# Patient Record
Sex: Female | Born: 1985 | Race: Black or African American | Hispanic: No | Marital: Single | Smoking: Former smoker
Health system: Southern US, Community
[De-identification: ages and names within clinical notes are randomized; demographics above are authoritative.]

## PROBLEM LIST (undated history)

## (undated) DIAGNOSIS — K089 Disorder of teeth and supporting structures, unspecified: Secondary | ICD-10-CM

## (undated) DIAGNOSIS — A6000 Herpesviral infection of urogenital system, unspecified: Secondary | ICD-10-CM

## (undated) DIAGNOSIS — G8929 Other chronic pain: Secondary | ICD-10-CM

## (undated) HISTORY — DX: Herpesviral infection of urogenital system, unspecified: A60.00

---
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
## 2005-02-28 ENCOUNTER — Emergency Department (HOSPITAL_COMMUNITY): Admission: EM | Admit: 2005-02-28 | Discharge: 2005-02-28 | Payer: Self-pay | Admitting: Emergency Medicine

## 2007-07-03 ENCOUNTER — Emergency Department (HOSPITAL_COMMUNITY): Admission: EM | Admit: 2007-07-03 | Discharge: 2007-07-03 | Payer: Self-pay | Admitting: Emergency Medicine

## 2009-12-07 ENCOUNTER — Emergency Department (HOSPITAL_COMMUNITY): Admission: EM | Admit: 2009-12-07 | Discharge: 2009-12-07 | Payer: Self-pay | Admitting: Emergency Medicine

## 2010-08-06 ENCOUNTER — Emergency Department (HOSPITAL_COMMUNITY): Admission: EM | Admit: 2010-08-06 | Discharge: 2010-08-06 | Payer: Self-pay | Admitting: Emergency Medicine

## 2011-02-14 LAB — URINALYSIS, ROUTINE W REFLEX MICROSCOPIC
Glucose, UA: NEGATIVE mg/dL
Hgb urine dipstick: NEGATIVE
Specific Gravity, Urine: 1.023 (ref 1.005–1.030)
Urobilinogen, UA: 1 mg/dL (ref 0.0–1.0)

## 2011-09-13 LAB — COMPREHENSIVE METABOLIC PANEL
ALT: 15
AST: 21
Albumin: 3.8
Alkaline Phosphatase: 42
Calcium: 9
GFR calc Af Amer: >60
Glucose, Bld: 99
Potassium: 3.3 — ABNORMAL LOW
Sodium: 140
Total Protein: 6.7

## 2011-09-13 LAB — URINALYSIS, ROUTINE W REFLEX MICROSCOPIC
Ketones, ur: NEGATIVE
Nitrite: NEGATIVE
Urobilinogen, UA: 1
pH: 6

## 2011-09-13 LAB — CBC
Hemoglobin: 12.3
MCHC: 33.5
Platelets: 238
RDW: 15.8 — ABNORMAL HIGH

## 2011-09-13 LAB — DIFFERENTIAL
Basophils Relative: 1
Eosinophils Absolute: 0.1
Eosinophils Relative: 1
Lymphs Abs: 1.8
Monocytes Absolute: 0.4
Monocytes Relative: 10

## 2011-09-13 LAB — PREGNANCY, URINE: Preg Test, Ur: NEGATIVE

## 2011-09-13 LAB — D-DIMER, QUANTITATIVE: D-Dimer, Quant: <0.22

## 2012-08-01 ENCOUNTER — Emergency Department (HOSPITAL_COMMUNITY)
Admission: EM | Admit: 2012-08-01 | Discharge: 2012-08-01 | Disposition: A | Discharge status: Home / Self Care | Payer: Self-pay | Attending: Emergency Medicine | Admitting: Emergency Medicine

## 2012-08-01 ENCOUNTER — Emergency Department (HOSPITAL_COMMUNITY): Payer: Self-pay

## 2012-08-01 ENCOUNTER — Encounter (HOSPITAL_COMMUNITY): Payer: Self-pay | Admitting: *Deleted

## 2012-08-01 DIAGNOSIS — M79673 Pain in unspecified foot: Secondary | ICD-10-CM

## 2012-08-01 DIAGNOSIS — M25579 Pain in unspecified ankle and joints of unspecified foot: Secondary | ICD-10-CM | POA: Insufficient documentation

## 2012-08-01 MED ORDER — HYDROCODONE-ACETAMINOPHEN 5-325 MG PO TABS: 1.0000 | ORAL_TABLET | Freq: Four times a day (QID) | ORAL | Status: AC | PRN

## 2012-08-01 MED ORDER — HYDROCODONE-ACETAMINOPHEN 5-325 MG PO TABS
1.0000 | ORAL_TABLET | Freq: Once | ORAL | Status: AC
  Administered 2012-08-01: 1 via ORAL
  Filled 2012-08-01: qty 1

## 2012-08-01 MED ORDER — IBUPROFEN 800 MG PO TABS: 800.0000 mg | ORAL_TABLET | Freq: Three times a day (TID) | ORAL | Status: AC | PRN

## 2012-08-01 MED ORDER — HYDROCODONE-ACETAMINOPHEN 10-325 MG PO TABS: 1.0000 | ORAL_TABLET | Freq: Once | ORAL | Status: DC

## 2012-08-01 MED ORDER — IBUPROFEN 400 MG PO TABS
800.0000 mg | ORAL_TABLET | Freq: Once | ORAL | Status: AC
  Administered 2012-08-01: 800 mg via ORAL
  Filled 2012-08-01: qty 2

## 2012-08-01 NOTE — ED Notes (Signed)
Ortho tech coming 

## 2012-08-01 NOTE — ED Notes (Signed)
Pt reports stepping wrong on left foot last night, reports unable to bear weight to left foot. Reports she is unable to wiggle her toes.

## 2012-08-01 NOTE — ED Provider Notes (Signed)
History     CSN: [id number]  Arrival date & time 08/01/12  4098   First MD Initiated Contact with Patient 08/01/12 2016      Chief Complaint  Patient presents with  . Foot Pain    (Consider location/radiation/quality/duration/timing/severity/associated sxs/prior treatment) HPI Comments: Pt is 26 yo F that miss stepped walking down the stairs yesterday rolling her left ankle, inversion. Denies numbness tingling or weakness of extremity, Reports her skin is intact and states that pain is worsened w weight bearing & relieved by nothing   Patient is a 26 y.o. female presenting with lower extremity pain.  Foot Pain This is a new problem. The current episode started today. The problem occurs constantly. The problem has been unchanged. Associated symptoms include joint swelling. Pertinent negatives include no congestion, coughing, diaphoresis, fever, headaches, myalgias or neck pain.    History reviewed. No pertinent past medical history.  History reviewed. No pertinent past surgical history.  History reviewed. No pertinent family history.  History  Substance Use Topics  . Smoking status: Never Smoker   . Smokeless tobacco: Not on file  . Alcohol Use: Yes     occ    OB History    Grav Para Term Preterm Abortions TAB SAB Ect Mult Living                  Review of Systems  Constitutional: Negative for fever, diaphoresis and activity change.  HENT: Negative for congestion and neck pain.   Respiratory: Negative for cough.   Genitourinary: Negative for dysuria.  Musculoskeletal: Positive for joint swelling and gait problem. Negative for myalgias.  Skin: Negative for color change and wound.  Neurological: Negative for headaches.  All other systems reviewed and are negative.    Allergies  Review of patient's allergies indicates no known allergies.  Home Medications  No current outpatient prescriptions on file.  BP 136/95  Pulse 62  Temp 99.1 F (37.3 C) (Oral)  Resp  18  SpO2 100%  LMP 07/04/2012  Physical Exam  Nursing note and vitals reviewed. Constitutional: She appears well-developed and well-nourished. No distress.  HENT:  Head: Normocephalic and atraumatic.  Eyes: Conjunctivae and EOM are normal.  Neck: Normal range of motion. Neck supple.  Cardiovascular:       Intact distal pulses, capillary refill < 3 seconds  Musculoskeletal:       Unable to perform active or passive ROM of left foot/ankle d/t pain. All other extremities with normal ROM  Neurological:       No sensory deficit  Skin: She is not diaphoretic.       Skin intact, no tenting    ED Course  Procedures (including critical care time)  Labs Reviewed - No data to display Dg Foot Complete Left  08/01/2012  *RADIOLOGY REPORT*  Clinical Data: Pain post trauma  LEFT FOOT - COMPLETE 3+ VIEW  Comparison: None.  Findings: Frontal, lateral, and oblique views were obtained.  No fracture or dislocation.  Joint spaces appear intact.  No erosive change  IMPRESSION: No appreciable fracture or dislocation.   Original Report Authenticated By: Arvin Collard. WOODRUFF III, M.D.      No diagnosis found.    MDM  Right foot/ankle pain  Patient X-Ray negative for obvious fracture or dislocation. Pain managed in ED. Pt advised to follow up with orthopedics if symptoms persist for possibility of missed fracture diagnosis. Patient given brace while in ED, conservative therapy recommended and discussed. Patient will be dc home &  is agreeable with above plan.         Jaci Carrel, [place] 08/01/12 2054

## 2012-08-01 NOTE — ED Notes (Signed)
The pt fell yesterday and since then has not been able to walk on her lt foot.  Swollen and she just returned from xray

## 2012-08-01 NOTE — Progress Notes (Signed)
Orthopedic Tech Progress Note Patient Details:  Kimberly Yoder [birth year] [id number]  Ortho Devices Type of Ortho Device: Crutches;ASO Ortho Device/Splint Location: (L) LE Ortho Device/Splint Interventions: Application   Jennye Moccasin 08/01/2012, 9:03 PM

## 2012-08-02 NOTE — ED Provider Notes (Signed)
Medical screening examination/treatment/procedure(s) were performed by non-physician practitioner and as supervising physician I was immediately available for consultation/collaboration.  Derwood Kaplan, MD 08/02/12 [phone]

## 2013-05-25 ENCOUNTER — Emergency Department (HOSPITAL_COMMUNITY)
Admission: EM | Admit: 2013-05-25 | Discharge: 2013-05-25 | Disposition: A | Discharge status: Home / Self Care | Payer: Self-pay | Attending: Emergency Medicine | Admitting: Emergency Medicine

## 2013-05-25 ENCOUNTER — Emergency Department (HOSPITAL_COMMUNITY): Payer: Self-pay

## 2013-05-25 ENCOUNTER — Encounter (HOSPITAL_COMMUNITY): Payer: Self-pay | Admitting: Emergency Medicine

## 2013-05-25 DIAGNOSIS — R05 Cough: Secondary | ICD-10-CM | POA: Insufficient documentation

## 2013-05-25 DIAGNOSIS — F17200 Nicotine dependence, unspecified, uncomplicated: Secondary | ICD-10-CM | POA: Insufficient documentation

## 2013-05-25 DIAGNOSIS — R059 Cough, unspecified: Secondary | ICD-10-CM | POA: Insufficient documentation

## 2013-05-25 DIAGNOSIS — J209 Acute bronchitis, unspecified: Secondary | ICD-10-CM | POA: Insufficient documentation

## 2013-05-25 DIAGNOSIS — R079 Chest pain, unspecified: Secondary | ICD-10-CM | POA: Insufficient documentation

## 2013-05-25 DIAGNOSIS — J40 Bronchitis, not specified as acute or chronic: Secondary | ICD-10-CM

## 2013-05-25 LAB — CBC WITH DIFFERENTIAL/PLATELET
Basophils Relative: 0 % (ref 0–1)
Eosinophils Absolute: 0.1 10*3/uL (ref 0.0–0.7)
HCT: 39.1 % (ref 36.0–46.0)
Hemoglobin: 13.4 g/dL (ref 12.0–15.0)
Lymphs Abs: 2 10*3/uL (ref 0.7–4.0)
MCH: 28.6 pg (ref 26.0–34.0)
MCHC: 34.3 g/dL (ref 30.0–36.0)
Monocytes Absolute: 0.6 10*3/uL (ref 0.1–1.0)
Monocytes Relative: 10 % (ref 3–12)
Neutrophils Relative %: 54 % (ref 43–77)
RBC: 4.68 MIL/uL (ref 3.87–5.11)

## 2013-05-25 LAB — POCT I-STAT TROPONIN I: Troponin i, poc: 0 ng/mL (ref 0.00–0.08)

## 2013-05-25 LAB — COMPREHENSIVE METABOLIC PANEL
Albumin: 4.2 g/dL (ref 3.5–5.2)
Alkaline Phosphatase: 54 U/L (ref 39–117)
BUN: 7 mg/dL (ref 6–23)
Chloride: 102 mEq/L (ref 96–112)
Creatinine, Ser: 0.7 mg/dL (ref 0.50–1.10)
GFR calc Af Amer: >90 mL/min (ref >90)
Glucose, Bld: 92 mg/dL (ref 70–99)
Potassium: 3.7 mEq/L (ref 3.5–5.1)
Total Bilirubin: 0.8 mg/dL (ref 0.3–1.2)

## 2013-05-25 MED ORDER — ALBUTEROL SULFATE HFA 108 (90 BASE) MCG/ACT IN AERS
2.0000 | INHALATION_SPRAY | Freq: Once | RESPIRATORY_TRACT | Status: AC
  Administered 2013-05-25: 2 via RESPIRATORY_TRACT
  Filled 2013-05-25: qty 6.7

## 2013-05-25 MED ORDER — HYDROCODONE-ACETAMINOPHEN 5-325 MG PO TABS: 2.0000 | ORAL_TABLET | ORAL | Status: DC | PRN

## 2013-05-25 MED ORDER — AZITHROMYCIN 250 MG PO TABS: ORAL_TABLET | ORAL | Status: DC

## 2013-05-25 NOTE — Progress Notes (Signed)
   CARE MANAGEMENT ED NOTE 05/25/2013  Patient:  Kimberly Yoder, Kimberly Yoder   Account Number:  [id number]  Date Initiated:  05/25/2013  Documentation initiated by:  Radford Pax  Subjective/Objective Assessment:   Patient presents to ED with chest pain     Subjective/Objective Assessment Detail:     Action/Plan:   Action/Plan Detail:   Anticipated DC Date:       Status Recommendation to Physician:   Result of Recommendation:    Other ED Services  Consult Working Plan    DC Planning Services  Other  PCP issues    Choice offered to / List presented to:            Status of service:  Completed, signed off  ED Comments:   ED Comments Detail:  Patient listed as not having a pcp.  EDCM porvided list of physicians who accept medicaid patients.  Informed patient to call DSS when she finds a pcp.

## 2013-05-25 NOTE — ED Provider Notes (Signed)
History    CSN: [id number] Arrival date & time 05/25/13  1239  First MD Initiated Contact with Patient 05/25/13 1447     No chief complaint on file.  (Consider location/radiation/quality/duration/timing/severity/associated sxs/prior Treatment) HPI Comments: Patient presents with cough and chest pain. She states she's had a two-week history of a nonproductive cough. She states that 2 days ago she started having some pain across her chest. She describes it as crampy feeling in the center of her chest and at times it's in her left lower chest. She states it's worse with coughing and worse with breathing. She denies any fevers or chills. She denies any shortness of breath. She denies any leg swelling. She denies any nausea or vomiting. She has no history of DVT, oral contraceptive use, recent immobilization. She is a smoker. She denies a history of past lung disease including asthma. She states the chest pain is intermittent  but seems to be brought on by coughing spells and she feels it more with deep breathing.  History reviewed. No pertinent past medical history. History reviewed. No pertinent past surgical history. No family history on file. History  Substance Use Topics  . Smoking status: Light Tobacco Smoker  . Smokeless tobacco: Not on file  . Alcohol Use: Yes     Comment: occ   OB History   Grav Para Term Preterm Abortions TAB SAB Ect Mult Living                 Review of Systems  Constitutional: Negative for fever, chills, diaphoresis and fatigue.  HENT: Negative for congestion, rhinorrhea and sneezing.   Eyes: Negative.   Respiratory: Positive for cough. Negative for chest tightness and shortness of breath.   Cardiovascular: Positive for chest pain. Negative for leg swelling.  Gastrointestinal: Negative for nausea, vomiting, abdominal pain, diarrhea and blood in stool.  Genitourinary: Negative for frequency, hematuria, flank pain and difficulty urinating.  Musculoskeletal:  Negative for back pain and arthralgias.  Skin: Negative for rash.  Neurological: Negative for dizziness, speech difficulty, weakness, numbness and headaches.    Allergies  Review of patient's allergies indicates no known allergies.  Home Medications   Current Outpatient Rx  Name  Route  Sig  Dispense  Refill  . ibuprofen (ADVIL,MOTRIN) 200 MG tablet   Oral   Take 400 mg by mouth every 6 (six) hours as needed for pain.         Marland Kitchen azithromycin (ZITHROMAX Z-PAK) 250 MG tablet      2 po day one, then 1 daily x 4 days   5 tablet   0   . HYDROcodone-acetaminophen (NORCO/VICODIN) 5-325 MG per tablet   Oral   Take 2 tablets by mouth every 4 (four) hours as needed for pain.   15 tablet   0    BP 119/78  Pulse 88  Temp(Src) 98.6 F (37 C) (Oral)  Resp 15  SpO2 100%  LMP 05/03/2013 Physical Exam  Constitutional: She is oriented to person, place, and time. She appears well-developed and well-nourished.  HENT:  Head: Normocephalic and atraumatic.  Eyes: Pupils are equal, round, and reactive to light.  Neck: Normal range of motion. Neck supple.  Cardiovascular: Normal rate, regular rhythm and normal heart sounds.   Pulmonary/Chest: Effort normal and breath sounds normal. No respiratory distress. She has no wheezes. She has no rales. She exhibits no tenderness.  Abdominal: Soft. Bowel sounds are normal. There is no tenderness. There is no rebound and no guarding.  Musculoskeletal: Normal range of motion. She exhibits no edema.  No calf tenderness  Lymphadenopathy:    She has no cervical adenopathy.  Neurological: She is alert and oriented to person, place, and time.  Skin: Skin is warm and dry. No rash noted.  Psychiatric: She has a normal mood and affect.    ED Course  Procedures (including critical care time) Results for orders placed during the hospital encounter of 05/25/13  CBC WITH DIFFERENTIAL      Result Value Range   WBC 5.9  4.0 - 10.5 K/uL   RBC 4.68  3.87 -  5.11 MIL/uL   Hemoglobin 13.4  12.0 - 15.0 g/dL   HCT 16.1  09.6 - 04.5 %   MCV 83.5  78.0 - 100.0 fL   MCH 28.6  26.0 - 34.0 pg   MCHC 34.3  30.0 - 36.0 g/dL   RDW 40.9 (*) 81.1 - 91.4 %   Platelets 283  150 - 400 K/uL   Neutrophils Relative % 54  43 - 77 %   Neutro Abs 3.2  1.7 - 7.7 K/uL   Lymphocytes Relative 35  12 - 46 %   Lymphs Abs 2.0  0.7 - 4.0 K/uL   Monocytes Relative 10  3 - 12 %   Monocytes Absolute 0.6  0.1 - 1.0 K/uL   Eosinophils Relative 1  0 - 5 %   Eosinophils Absolute 0.1  0.0 - 0.7 K/uL   Basophils Relative 0  0 - 1 %   Basophils Absolute 0.0  0.0 - 0.1 K/uL  COMPREHENSIVE METABOLIC PANEL      Result Value Range   Sodium 136  135 - 145 mEq/L   Potassium 3.7  3.5 - 5.1 mEq/L   Chloride 102  96 - 112 mEq/L   CO2 23  19 - 32 mEq/L   Glucose, Bld 92  70 - 99 mg/dL   BUN 7  6 - 23 mg/dL   Creatinine, Ser 7.82  0.50 - 1.10 mg/dL   Calcium 9.4  8.4 - 95.6 mg/dL   Total Protein 7.8  6.0 - 8.3 g/dL   Albumin 4.2  3.5 - 5.2 g/dL   AST 18  0 - 37 U/L   ALT 12  0 - 35 U/L   Alkaline Phosphatase 54  39 - 117 U/L   Total Bilirubin 0.8  0.3 - 1.2 mg/dL   GFR calc non Af Amer >90  >90 mL/min   GFR calc Af Amer >90  >90 mL/min  D-DIMER, QUANTITATIVE      Result Value Range   D-Dimer, Quant <0.27  0.00 - 0.48 ug/mL-FEU  POCT I-STAT TROPONIN I      Result Value Range   Troponin i, poc 0.00  0.00 - 0.08 ng/mL   Comment 3            Dg Chest 2 View  05/25/2013   *RADIOLOGY REPORT*  Clinical Data: Cough, chest pain  CHEST - 2 VIEW  Comparison: July 03, 2007.  Findings: Cardiomediastinal silhouette appears normal.  No acute pulmonary disease is noted.  Bony thorax is intact.  IMPRESSION: No acute cardiopulmonary abnormality seen.   Original Report Authenticated By: Lupita Raider.,  M.D.   Date: 05/25/2013  Rate: 71  Rhythm: normal sinus rhythm  QRS Axis: normal  Intervals: normal  ST/T Wave abnormalities: normal  Conduction Disutrbances:none  Narrative  Interpretation:   Old EKG Reviewed: none available      1.  Chest pain   2. Bronchitis     MDM  Patient with cough and associated chest pain. She has no dyspnea, hypoxia, tachycardia or other symptoms suggestive of PE. Her chest x-ray did not demonstrate pneumonia or pneumothorax. Her symptoms are not suggestive of acute coronary syndrome. Given her ongoing cough for 2 weeks with no associated chest pain we'll go ahead and treat her as a bronchitis. She was given a list of possible outpatient followup resources. She was advised to return to the emergency department if her symptoms worsen or she has associated shortness of breath.   Rolan Bucco, MD 05/25/13 [phone]

## 2013-05-25 NOTE — ED Notes (Signed)
WRU:[id number]<UJ> Expected date:05/25/13<BR> Expected time:<BR> Means of arrival:<BR> Comments:<BR> Triage 1

## 2013-05-25 NOTE — ED Notes (Addendum)
Pt reports chest pain that started 3 days ago. EKG done. Pt reports pain on sides/flanks when she coughs. Denies fever or recent sickness. Pt reports nausea and vomiting 3 days ago and Diarrhea 2 days ago but has resolved. Pt in NAD. Skin warm and dry. Pt on monitor. Pt also reports a cough x2 weeks.

## 2014-05-12 ENCOUNTER — Emergency Department (HOSPITAL_COMMUNITY)
Admission: EM | Admit: 2014-05-12 | Discharge: 2014-05-13 | Disposition: A | Discharge status: Home / Self Care | Payer: Medicaid Other | Attending: Emergency Medicine | Admitting: Emergency Medicine

## 2014-05-12 ENCOUNTER — Encounter (HOSPITAL_COMMUNITY): Payer: Self-pay | Admitting: Emergency Medicine

## 2014-05-12 DIAGNOSIS — F1010 Alcohol abuse, uncomplicated: Secondary | ICD-10-CM | POA: Insufficient documentation

## 2014-05-12 DIAGNOSIS — L259 Unspecified contact dermatitis, unspecified cause: Secondary | ICD-10-CM

## 2014-05-12 DIAGNOSIS — R519 Headache, unspecified: Secondary | ICD-10-CM

## 2014-05-12 DIAGNOSIS — L250 Unspecified contact dermatitis due to cosmetics: Secondary | ICD-10-CM | POA: Insufficient documentation

## 2014-05-12 DIAGNOSIS — R51 Headache: Secondary | ICD-10-CM

## 2014-05-12 MED ORDER — DIPHENHYDRAMINE HCL 50 MG/ML IJ SOLN
50.0000 mg | Freq: Once | INTRAMUSCULAR | Status: AC
  Administered 2014-05-12: 50 mg via INTRAMUSCULAR
  Filled 2014-05-12: qty 1

## 2014-05-12 MED ORDER — METHYLPREDNISOLONE SODIUM SUCC 125 MG IJ SOLR
125.0000 mg | Freq: Once | INTRAMUSCULAR | Status: AC
  Administered 2014-05-12: 125 mg via INTRAMUSCULAR
  Filled 2014-05-12: qty 2

## 2014-05-12 MED ORDER — FAMOTIDINE 20 MG PO TABS
20.0000 mg | ORAL_TABLET | Freq: Once | ORAL | Status: AC
  Administered 2014-05-12: 20 mg via ORAL
  Filled 2014-05-12: qty 1

## 2014-05-12 MED ORDER — HYDROMORPHONE HCL PF 1 MG/ML IJ SOLN
1.0000 mg | Freq: Once | INTRAMUSCULAR | Status: AC
  Administered 2014-05-13: 1 mg via INTRAMUSCULAR
  Filled 2014-05-12: qty 1

## 2014-05-12 NOTE — ED Notes (Signed)
Pt states itching and burning to scalp started about 2 hours ago. Pt denies difficulty swallowing or breathing. Pt speaks in complete sentences. No acute distress. Pt given ice pack to hold on head.

## 2014-05-12 NOTE — ED Notes (Signed)
Pt presents with c/o burning and itching around her scalp area . Pt says that she believes she is having an allergic reaction to her hair that was placed two days ago. Pt is very anxious and tearful and is moving her head around her scratching her head. Pt says the burning and itching started today, pt denies any shortness of breath at this time.

## 2014-05-12 NOTE — ED Provider Notes (Signed)
CSN: [id number]     Arrival date & time 05/12/14  2158 History   First MD Initiated Contact with Patient 05/12/14 2223     Chief Complaint  Patient presents with  . Allergic Reaction     (Consider location/radiation/quality/duration/timing/severity/associated sxs/prior Treatment) HPI Kimberly Yoder is a 28 y.o. female who presents to emergency department complaining of burning, pain, itching to the scalp. Patient states she put oil in her hair because her scalp was dry few hours ago. States shortly after that began having burning and itching in her scalp. She states she has used this oil in the past. She has never had a reaction before. She states she washed her hair in the sink thoroughly. States that did not help. States the burning is getting worse. She did not take any medications prior to arrival. She denies respiratory distress, no rash anywhere else, no swelling of lips or tongue.   History reviewed. No pertinent past medical history. History reviewed. No pertinent past surgical history. No family history on file. History  Substance Use Topics  . Smoking status: Light Tobacco Smoker  . Smokeless tobacco: Not on file  . Alcohol Use: Yes     Comment: occ   OB History   Grav Para Term Preterm Abortions TAB SAB Ect Mult Living                 Review of Systems  Constitutional: Negative for fever and chills.  Respiratory: Negative for cough, chest tightness and shortness of breath.   Cardiovascular: Negative for chest pain, palpitations and leg swelling.  Genitourinary: Negative for dysuria and flank pain.  Musculoskeletal: Negative for arthralgias, myalgias, neck pain and neck stiffness.  Skin: Positive for rash.  Neurological: Negative for dizziness, weakness and headaches.  All other systems reviewed and are negative.     Allergies  Review of patient's allergies indicates no known allergies.  Home Medications   Prior to Admission medications   Medication Sig Start  Date End Date Taking? Authorizing Provider  albuterol (PROVENTIL HFA;VENTOLIN HFA) 108 (90 BASE) MCG/ACT inhaler Inhale 1-2 puffs into the lungs every 6 (six) hours as needed for wheezing or shortness of breath.   Yes Historical Provider, MD  ibuprofen (ADVIL,MOTRIN) 800 MG tablet Take 800 mg by mouth every 8 (eight) hours as needed (for pain.).   Yes Historical Provider, MD   BP 124/86  Pulse 104  Temp(Src) 99.1 F (37.3 C) (Oral)  Resp 14  SpO2 100%  LMP 05/05/2014 Physical Exam  Nursing note and vitals reviewed. Constitutional: She appears well-developed and well-nourished.  Patient is standing with her had flipped down. She is crying and screaming  HENT:  Head: Normocephalic.  Eyes: Conjunctivae are normal.  Neck: Neck supple.  Cardiovascular: Normal rate, regular rhythm and normal heart sounds.   Pulmonary/Chest: Effort normal and breath sounds normal. No respiratory distress. She has no wheezes. She has no rales.  Musculoskeletal: She exhibits no edema.  Neurological: She is alert.  Skin: Skin is warm and dry.  Hair in braids. Scalp is erythematous diffusely. No lesions. Very tender diffusely.  Psychiatric: She has a normal mood and affect. Her behavior is normal.    ED Course  Procedures (including critical care time) Labs Review Labs Reviewed - No data to display  Imaging Review No results found.   EKG Interpretation None      MDM   Final diagnoses:  Contact dermatitis  Scalp pain    Pt here with what appears  to be an allergic reaction to the scalp. States started soon after applying an oil to her hair. Stats used same oil before with no problems. She denies any other products. Already washed her hair thoroughly. Will order solumedrol, benadryl, pepcid.   1:03 AM Pt had no relief with solumedrol, benadryl, pepcid. Ordered dilaudid for pain. Pt was taken into the shower, hair was washed thoroughly with soap. No relief. Will try ativan. Instructed pt to take  braids out. At this time will dc home with follow up as needed.   Filed Vitals:   05/12/14 2203  BP: 124/86  Pulse: 104  Temp: 99.1 F (37.3 C)  TempSrc: Oral  Resp: 14  SpO2: 100%     Joshu Furukawa A Yaneth Fairbairn, PA-C 05/13/14 0157

## 2014-05-12 NOTE — ED Notes (Signed)
Pt escorted to the shower in [facility].

## 2014-05-12 NOTE — ED Notes (Signed)
[facility] waiting for Pt.

## 2014-05-13 MED ORDER — HYDROXYZINE HCL 25 MG PO TABS
50.0000 mg | ORAL_TABLET | Freq: Once | ORAL | Status: AC
  Administered 2014-05-13: 50 mg via ORAL
  Filled 2014-05-13: qty 2

## 2014-05-13 MED ORDER — HYDROXYZINE HCL 25 MG PO TABS: 25.0000 mg | ORAL_TABLET | ORAL | Status: DC | PRN

## 2014-05-13 MED ORDER — FAMOTIDINE 20 MG PO TABS: 20.0000 mg | ORAL_TABLET | Freq: Two times a day (BID) | ORAL | Status: DC

## 2014-05-13 MED ORDER — PREDNISONE 20 MG PO TABS: 40.0000 mg | ORAL_TABLET | Freq: Every day | ORAL | Status: DC

## 2014-05-13 MED ORDER — LORAZEPAM 1 MG PO TABS
1.0000 mg | ORAL_TABLET | Freq: Once | ORAL | Status: AC
  Administered 2014-05-13: 1 mg via ORAL
  Filled 2014-05-13: qty 1

## 2014-05-13 NOTE — Discharge Instructions (Signed)
Prednisone for allergic reaction for the next 3 days. Vistaril  For itching. pepcid for allergic reaction. Follow up with your doctor or return here for recheck if symptoms are worsening.   Contact Dermatitis Contact dermatitis is a reaction to certain substances that touch the skin. Contact dermatitis can be either irritant contact dermatitis or allergic contact dermatitis. Irritant contact dermatitis does not require previous exposure to the substance for a reaction to occur.Allergic contact dermatitis only occurs if you have been exposed to the substance before. Upon a repeat exposure, your body reacts to the substance.  CAUSES  Many substances can cause contact dermatitis. Irritant dermatitis is most commonly caused by repeated exposure to mildly irritating substances, such as:  Makeup.  Soaps.  Detergents.  Bleaches.  Acids.  Metal salts, such as nickel. Allergic contact dermatitis is most commonly caused by exposure to:  Poisonous plants.  Chemicals (deodorants, shampoos).  Jewelry.  Latex.  Neomycin in triple antibiotic cream.  Preservatives in products, including clothing. SYMPTOMS  The area of skin that is exposed may develop:  Dryness or flaking.  Redness.  Cracks.  Itching.  Pain or a burning sensation.  Blisters. With allergic contact dermatitis, there may also be swelling in areas such as the eyelids, mouth, or genitals.  DIAGNOSIS  Your caregiver can usually tell what the problem is by doing a physical exam. In cases where the cause is uncertain and an allergic contact dermatitis is suspected, a patch skin test may be performed to help determine the cause of your dermatitis. TREATMENT Treatment includes protecting the skin from further contact with the irritating substance by avoiding that substance if possible. Barrier creams, powders, and gloves may be helpful. Your caregiver may also recommend:  Steroid creams or ointments applied 2 times daily.  For best results, soak the rash area in cool water for 20 minutes. Then apply the medicine. Cover the area with a plastic wrap. You can store the steroid cream in the refrigerator for a "chilly" effect on your rash. That may decrease itching. Oral steroid medicines may be needed in more severe cases.  Antibiotics or antibacterial ointments if a skin infection is present.  Antihistamine lotion or an antihistamine taken by mouth to ease itching.  Lubricants to keep moisture in your skin.  Burow's solution to reduce redness and soreness or to dry a weeping rash. Mix one packet or tablet of solution in 2 cups cool water. Dip a clean washcloth in the mixture, wring it out a bit, and put it on the affected area. Leave the cloth in place for 30 minutes. Do this as often as possible throughout the day.  Taking several cornstarch or baking soda baths daily if the area is too large to cover with a washcloth. Harsh chemicals, such as alkalis or acids, can cause skin damage that is like a burn. You should flush your skin for 15 to 20 minutes with cold water after such an exposure. You should also seek immediate medical care after exposure. Bandages (dressings), antibiotics, and pain medicine may be needed for severely irritated skin.  HOME CARE INSTRUCTIONS  Avoid the substance that caused your reaction.  Keep the area of skin that is affected away from hot water, soap, sunlight, chemicals, acidic substances, or anything else that would irritate your skin.  Do not scratch the rash. Scratching may cause the rash to become infected.  You may take cool baths to help stop the itching.  Only take over-the-counter or prescription medicines as directed  by your caregiver.  See your caregiver for follow-up care as directed to make sure your skin is healing properly. SEEK MEDICAL CARE IF:   Your condition is not better after 3 days of treatment.  You seem to be getting worse.  You see signs of infection  such as swelling, tenderness, redness, soreness, or warmth in the affected area.  You have any problems related to your medicines. Document Released: 11/12/2000 Document Revised: 02/07/2012 Document Reviewed: 04/20/2011 [facility] Patient Information 2014 [place] [place].

## 2014-05-13 NOTE — ED Provider Notes (Signed)
Medical screening examination/treatment/procedure(s) were conducted as a shared visit with non-physician practitioner(s) and myself.  I personally evaluated the patient during the encounter.   EKG Interpretation None     Pt seen with PA.  Diffuse scalp edema, unknown source  Kimberly Yoder M [name], MD 05/13/14 [phone]

## 2014-10-07 ENCOUNTER — Emergency Department (HOSPITAL_COMMUNITY)
Admission: EM | Admit: 2014-10-07 | Discharge: 2014-10-07 | Disposition: A | Discharge status: Home / Self Care | Payer: Medicaid Other | Attending: Emergency Medicine | Admitting: Emergency Medicine

## 2014-10-07 ENCOUNTER — Emergency Department (HOSPITAL_COMMUNITY): Payer: Medicaid Other

## 2014-10-07 ENCOUNTER — Encounter (HOSPITAL_COMMUNITY): Payer: Self-pay | Admitting: Emergency Medicine

## 2014-10-07 DIAGNOSIS — N898 Other specified noninflammatory disorders of vagina: Secondary | ICD-10-CM

## 2014-10-07 DIAGNOSIS — B9689 Other specified bacterial agents as the cause of diseases classified elsewhere: Secondary | ICD-10-CM

## 2014-10-07 DIAGNOSIS — Z79899 Other long term (current) drug therapy: Secondary | ICD-10-CM | POA: Insufficient documentation

## 2014-10-07 DIAGNOSIS — Z711 Person with feared health complaint in whom no diagnosis is made: Secondary | ICD-10-CM

## 2014-10-07 DIAGNOSIS — Z720 Tobacco use: Secondary | ICD-10-CM | POA: Insufficient documentation

## 2014-10-07 DIAGNOSIS — R1030 Lower abdominal pain, unspecified: Secondary | ICD-10-CM

## 2014-10-07 DIAGNOSIS — R109 Unspecified abdominal pain: Secondary | ICD-10-CM

## 2014-10-07 DIAGNOSIS — R112 Nausea with vomiting, unspecified: Secondary | ICD-10-CM | POA: Insufficient documentation

## 2014-10-07 DIAGNOSIS — Z7952 Long term (current) use of systemic steroids: Secondary | ICD-10-CM | POA: Insufficient documentation

## 2014-10-07 DIAGNOSIS — Z113 Encounter for screening for infections with a predominantly sexual mode of transmission: Secondary | ICD-10-CM | POA: Insufficient documentation

## 2014-10-07 DIAGNOSIS — N760 Acute vaginitis: Secondary | ICD-10-CM | POA: Insufficient documentation

## 2014-10-07 LAB — URINALYSIS, ROUTINE W REFLEX MICROSCOPIC
Bilirubin Urine: NEGATIVE
GLUCOSE, UA: NEGATIVE mg/dL
HGB URINE DIPSTICK: NEGATIVE
KETONES UR: NEGATIVE mg/dL
LEUKOCYTES UA: NEGATIVE
Nitrite: NEGATIVE
PH: 8 (ref 5.0–8.0)
PROTEIN: NEGATIVE mg/dL
Specific Gravity, Urine: 1.019 (ref 1.005–1.030)
Urobilinogen, UA: 1 mg/dL (ref 0.0–1.0)

## 2014-10-07 LAB — COMPREHENSIVE METABOLIC PANEL
ALT: 19 U/L (ref 0–35)
ANION GAP: 14 (ref 5–15)
AST: 25 U/L (ref 0–37)
Albumin: 4.6 g/dL (ref 3.5–5.2)
Alkaline Phosphatase: 62 U/L (ref 39–117)
BILIRUBIN TOTAL: 0.6 mg/dL (ref 0.3–1.2)
BUN: 12 mg/dL (ref 6–23)
CALCIUM: 9.6 mg/dL (ref 8.4–10.5)
CHLORIDE: 98 meq/L (ref 96–112)
CO2: 25 meq/L (ref 19–32)
CREATININE: 0.82 mg/dL (ref 0.50–1.10)
GFR calc Af Amer: >90 mL/min (ref >90)
Glucose, Bld: 100 mg/dL — ABNORMAL HIGH (ref 70–99)
Potassium: 3.9 mEq/L (ref 3.7–5.3)
Sodium: 137 mEq/L (ref 137–147)
Total Protein: 8.4 g/dL — ABNORMAL HIGH (ref 6.0–8.3)

## 2014-10-07 LAB — WET PREP, GENITAL
Trich, Wet Prep: NONE SEEN
WBC WET PREP: NONE SEEN
YEAST WET PREP: NONE SEEN

## 2014-10-07 LAB — CBC WITH DIFFERENTIAL/PLATELET
BASOS ABS: 0 10*3/uL (ref 0.0–0.1)
BASOS PCT: 0 % (ref 0–1)
Eosinophils Absolute: 0.1 10*3/uL (ref 0.0–0.7)
Eosinophils Relative: 1 % (ref 0–5)
HCT: 40.9 % (ref 36.0–46.0)
Hemoglobin: 13.8 g/dL (ref 12.0–15.0)
LYMPHS PCT: 35 % (ref 12–46)
Lymphs Abs: 2.5 10*3/uL (ref 0.7–4.0)
MCH: 27.8 pg (ref 26.0–34.0)
MCHC: 33.7 g/dL (ref 30.0–36.0)
MCV: 82.5 fL (ref 78.0–100.0)
Monocytes Absolute: 0.7 10*3/uL (ref 0.1–1.0)
Monocytes Relative: 9 % (ref 3–12)
NEUTROS ABS: 3.9 10*3/uL (ref 1.7–7.7)
NEUTROS PCT: 55 % (ref 43–77)
PLATELETS: 301 10*3/uL (ref 150–400)
RBC: 4.96 MIL/uL (ref 3.87–5.11)
RDW: 15.2 % (ref 11.5–15.5)
WBC: 7.2 10*3/uL (ref 4.0–10.5)

## 2014-10-07 LAB — LIPASE, BLOOD: LIPASE: 53 U/L (ref 11–59)

## 2014-10-07 LAB — RPR

## 2014-10-07 LAB — PREGNANCY, URINE: Preg Test, Ur: NEGATIVE

## 2014-10-07 MED ORDER — AZITHROMYCIN 250 MG PO TABS
1000.0000 mg | ORAL_TABLET | Freq: Once | ORAL | Status: AC
  Administered 2014-10-07: 1000 mg via ORAL
  Filled 2014-10-07: qty 4

## 2014-10-07 MED ORDER — MORPHINE SULFATE 4 MG/ML IJ SOLN
4.0000 mg | Freq: Once | INTRAMUSCULAR | Status: AC
  Administered 2014-10-07: 4 mg via INTRAVENOUS
  Filled 2014-10-07: qty 1

## 2014-10-07 MED ORDER — IOHEXOL 300 MG/ML  SOLN
100.0000 mL | Freq: Once | INTRAMUSCULAR | Status: AC | PRN
  Administered 2014-10-07: 100 mL via INTRAVENOUS

## 2014-10-07 MED ORDER — CEFTRIAXONE SODIUM 250 MG IJ SOLR
250.0000 mg | Freq: Once | INTRAMUSCULAR | Status: AC
  Administered 2014-10-07: 250 mg via INTRAMUSCULAR
  Filled 2014-10-07: qty 250

## 2014-10-07 MED ORDER — PROMETHAZINE HCL 25 MG PO TABS: 25.0000 mg | ORAL_TABLET | Freq: Four times a day (QID) | ORAL | Status: DC | PRN

## 2014-10-07 MED ORDER — ONDANSETRON HCL 4 MG/2ML IJ SOLN
4.0000 mg | Freq: Once | INTRAMUSCULAR | Status: AC
  Administered 2014-10-07: 4 mg via INTRAVENOUS
  Filled 2014-10-07: qty 2

## 2014-10-07 MED ORDER — HYDROCODONE-ACETAMINOPHEN 5-325 MG PO TABS: 1.0000 | ORAL_TABLET | ORAL | Status: DC | PRN

## 2014-10-07 MED ORDER — METRONIDAZOLE 500 MG PO TABS: 500.0000 mg | ORAL_TABLET | Freq: Two times a day (BID) | ORAL | Status: DC

## 2014-10-07 MED ORDER — SODIUM CHLORIDE 0.9 % IV BOLUS (SEPSIS)
1000.0000 mL | Freq: Once | INTRAVENOUS | Status: AC
  Administered 2014-10-07: 1000 mL via INTRAVENOUS

## 2014-10-07 NOTE — Discharge Instructions (Signed)
Bacterial Vaginosis °Bacterial vaginosis is a vaginal infection that occurs when the normal balance of bacteria in the vagina is disrupted. It results from an overgrowth of certain bacteria. This is the most common vaginal infection in women of childbearing age. Treatment is important to prevent complications, especially in pregnant women, as it can cause a premature delivery. °CAUSES  °Bacterial vaginosis is caused by an increase in harmful bacteria that are normally present in smaller amounts in the vagina. Several different kinds of bacteria can cause bacterial vaginosis. However, the reason that the condition develops is not fully understood. °RISK FACTORS °Certain activities or behaviors can put you at an increased risk of developing bacterial vaginosis, including: °· Having a new sex partner or multiple sex partners. °· Douching. °· Using an intrauterine device (IUD) for contraception. °Women do not get bacterial vaginosis from toilet seats, bedding, swimming pools, or contact with objects around them. °SIGNS AND SYMPTOMS  °Some women with bacterial vaginosis have no signs or symptoms. Common symptoms include: °· Grey vaginal discharge. °· A fishlike odor with discharge, especially after sexual intercourse. °· Itching or burning of the vagina and vulva. °· Burning or pain with urination. °DIAGNOSIS  °Your health care provider will take a medical history and examine the vagina for signs of bacterial vaginosis. A sample of vaginal fluid may be taken. Your health care provider will look at this sample under a microscope to check for bacteria and abnormal cells. A vaginal pH test may also be done.  °TREATMENT  °Bacterial vaginosis may be treated with antibiotic medicines. These may be given in the form of a pill or a vaginal cream. A second round of antibiotics may be prescribed if the condition comes back after treatment.  °HOME CARE INSTRUCTIONS  °· Only take over-the-counter or prescription medicines as  directed by your health care provider. °· If antibiotic medicine was prescribed, take it as directed. Make sure you finish it even if you start to feel better. °· Do not have sex until treatment is completed. °· Tell all sexual partners that you have a vaginal infection. They should see their health care provider and be treated if they have problems, such as a mild rash or itching. °· Practice safe sex by using condoms and only having one sex partner. °SEEK MEDICAL CARE IF:  °· Your symptoms are not improving after 3 days of treatment. °· You have increased discharge or pain. °· You have a fever. °MAKE SURE YOU:  °· Understand these instructions. °· Will watch your condition. °· Will get help right away if you are not doing well or get worse. °FOR MORE INFORMATION  °Centers for Disease Control and Prevention, Division of STD Prevention: www.cdc.gov/std °American Sexual Health Association (ASHA): www.ashastd.org  °Document Released: 11/15/2005 Document Revised: 09/05/2013 Document Reviewed: 06/27/2013 °ExitCare® Patient Information ©2015 ExitCare, LLC. This information is not intended to replace advice given to you by your health care provider. Make sure you discuss any questions you have with your health care provider. ° °Sexually Transmitted Disease °A sexually transmitted disease (STD) is a disease or infection that may be passed (transmitted) from person to person, usually during sexual activity. This may happen by way of saliva, semen, blood, vaginal mucus, or urine. Common STDs include:  °· Gonorrhea.   °· Chlamydia.   °· Syphilis.   °· HIV and AIDS.   °· Genital herpes.   °· Hepatitis B and C.   °· Trichomonas.   °· Human papillomavirus (HPV).   °· Pubic lice.   °· Scabies. °· Mites. °·   Bacterial vaginosis. WHAT ARE CAUSES OF STDs? An STD may be caused by bacteria, a virus, or parasites. STDs are often transmitted during sexual activity if one person is infected. However, they may also be transmitted through  nonsexual means. STDs may be transmitted after:   Sexual intercourse with an infected person.   Sharing sex toys with an infected person.   Sharing needles with an infected person or using unclean piercing or tattoo needles.  Having intimate contact with the genitals, mouth, or rectal areas of an infected person.   Exposure to infected fluids during birth. WHAT ARE THE SIGNS AND SYMPTOMS OF STDs? Different STDs have different symptoms. Some people may not have any symptoms. If symptoms are present, they may include:   Painful or bloody urination.   Pain in the pelvis, abdomen, vagina, anus, throat, or eyes.   A skin rash, itching, or irritation.  Growths, ulcerations, blisters, or sores in the genital and anal areas.  Abnormal vaginal discharge with or without bad odor.   Penile discharge in men.   Fever.   Pain or bleeding during sexual intercourse.   Swollen glands in the groin area.   Yellow skin and eyes (jaundice). This is seen with hepatitis.   Swollen testicles.  Infertility.  Sores and blisters in the mouth. HOW ARE STDs DIAGNOSED? To make a diagnosis, your health care provider may:   Take a medical history.   Perform a physical exam.   Take a sample of any discharge to examine.  Swab the throat, cervix, opening to the penis, rectum, or vagina for testing.  Test a sample of your first morning urine.   Perform blood tests.   Perform a Pap test, if this applies.   Perform a colposcopy.   Perform a laparoscopy.  HOW ARE STDs TREATED? Treatment depends on the STD. Some STDs may be treated but not cured.   Chlamydia, gonorrhea, trichomonas, and syphilis can be cured with antibiotic medicine.   Genital herpes, hepatitis, and HIV can be treated, but not cured, with prescribed medicines. The medicines lessen symptoms.   Genital warts from HPV can be treated with medicine or by freezing, burning (electrocautery), or surgery. Warts  may come back.   HPV cannot be cured with medicine or surgery. However, abnormal areas may be removed from the cervix, vagina, or vulva.   If your diagnosis is confirmed, your recent sexual partners need treatment. This is true even if they are symptom-free or have a negative culture or evaluation. They should not have sex until their health care providers say it is okay. HOW CAN I REDUCE MY RISK OF GETTING AN STD? Take these steps to reduce your risk of getting an STD:  Use latex condoms, dental dams, and water-soluble lubricants during sexual activity. Do not use petroleum jelly or oils.  Avoid having multiple sex partners.  Do not have sex with someone who has other sex partners.  Do not have sex with anyone you do not know or who is at high risk for an STD.  Avoid risky sex practices that can break your skin.  Do not have sex if you have open sores on your mouth or skin.  Avoid drinking too much alcohol or taking illegal drugs. Alcohol and drugs can affect your judgment and put you in a vulnerable position.  Avoid engaging in oral and anal sex acts.  Get vaccinated for HPV and hepatitis. If you have not received these vaccines in the past, talk to your  health care provider about whether one or both might be right for you.   If you are at risk of being infected with HIV, it is recommended that you take a prescription medicine daily to prevent HIV infection. This is called pre-exposure prophylaxis (PrEP). You are considered at risk if:  You are a man who has sex with other men (MSM).  You are a heterosexual man or woman and are sexually active with more than one partner.  You take drugs by injection.  You are sexually active with a partner who has HIV.  Talk with your health care provider about whether you are at high risk of being infected with HIV. If you choose to begin PrEP, you should first be tested for HIV. You should then be tested every 3 months for as long as you  are taking PrEP.  WHAT SHOULD I DO IF I THINK I HAVE AN STD?  See your health care provider.   Tell your sexual partner(s). They should be tested and treated for any STDs.  Do not have sex until your health care provider says it is okay. WHEN SHOULD I GET IMMEDIATE MEDICAL CARE? Contact your health care provider right away if:   You have severe abdominal pain.  You are a man and notice swelling or pain in your testicles.  You are a woman and notice swelling or pain in your vagina. Document Released: 02/05/2003 Document Revised: 11/20/2013 Document Reviewed: 06/05/2013 [facility] Patient Information 2015 [place], [place]. This information is not intended to replace advice given to you by your health care provider. Make sure you discuss any questions you have with your health care provider.   Emergency Department Resource Guide 1) Find a Doctor and Pay Out of Pocket Although you won't have to find out who is covered by your insurance plan, it is a good idea to ask around and get recommendations. You will then need to call the office and see if the doctor you have chosen will accept you as a new patient and what types of options they offer for patients who are self-pay. Some doctors offer discounts or will set up payment plans for their patients who do not have insurance, but you will need to ask so you aren't surprised when you get to your appointment.  2) Contact Your Local Health Department Not all health departments have doctors that can see patients for sick visits, but many do, so it is worth a call to see if yours does. If you don't know where your local health department is, you can check in your phone book. The CDC also has a tool to help you locate your state's health department, and many state websites also have listings of all of their local health departments.  3) Find a Walk-in Clinic If your illness is not likely to be very severe or complicated, you may want to try a walk in  clinic. These are popping up all over the country in pharmacies, drugstores, and shopping centers. They're usually staffed by nurse practitioners or physician assistants that have been trained to treat common illnesses and complaints. They're usually fairly quick and inexpensive. However, if you have serious medical issues or chronic medical problems, these are probably not your best option.  No Primary Care Doctor: - Call Health Connect at  [phone] - they can help you locate a primary care doctor that  accepts your insurance, provides certain services, etc. - Physician Referral Service- [phone]  Chronic Pain Problems: Organization  Address  Phone   Notes  [facility]  [phone] Patients need to be referred by their primary care doctor.   Medication Assistance: Organization         Address  Phone   Notes  [facility] Medication [facility] [address] [place], [place] [address] [phone] --Must be a resident of [facility] -- Must have NO insurance coverage whatsoever (no Medicaid/ Medicare, etc.) -- The pt. MUST have a primary care doctor that directs their care regularly and follows them in the community   [facility]ist  [phone]   [employer]  [phone]    Agencies that provide inexpensive medical care: Organization         Address  Phone   Notes  [facility]  [phone]   [facility]    [phone]   [facility] [address] [place], [place] [address] [phone]   [facility] [address], [place] [phone]   Planned Parenthood    [phone]   [facility]    [phone]   [facility]  [address], [place] Phone:  [phone], Fax:  [phone] Hours of Operation:  9 am - 6 pm, M-F.  Also accepts Medicaid/Medicare and self-pay.  [facility]  [address], [place] Phone: [phone], Fax: [phone]. Hours of Operation:  8:30 am - 5:30 pm, M-F.  Also accepts Medicaid and self-pay.  [facility] [place] [address], [place] Phone: [phone]   [facility] [address] Natasha Bence [place], [place] [phone], Ext. [phone] Mondays & Thursdays: 7-9 AM.  First 15 patients are seen on a first come, first serve basis.    Medicaid-accepting [facility] Providers:  Organization         Address  Phone   Notes  [facility] [address], [place] [phone] Also accepts self-pay patients.  [facility] [address] Laurell Josephs [place], [place]  [phone]   [facility] [address], [place] [phone]   [facility] [address] [place] [phone]   Renaye Rakers [address], [place]   [phone] Only accepts [place] patients after they have their name applied to their card.   Self-Pay (no insurance) in [facility]:  Organization         Address  Phone   Notes  Sickle Cell Patients, [facility] [address] [place], [place] [phone]   [facility] [address] [place], [place] [phone]   [facility] [place]  [address],  [phone]   [facility]/Dr. Osei-Bonsu  [address] [place] or [address], [place] [phone] Phone number for both [place] and [place] locations is the same.  [facility] [address], [place] [phone]   [facility] [address], [place] or [address] [phone]   [facility] [address] [place] [phone], phone; [phone], fax Sees patients  1st and 3rd Saturday of every month.  Must not qualify  for public or private insurance (i.e. Medicaid, Medicare, [place] Health Choice, Veterans' Benefits)  Household income should be no more than 200% of the poverty level The clinic cannot treat you if you are pregnant or think you are pregnant  Sexually transmitted diseases are not treated at the clinic.    Dental Care: Organization         Address  Phone  Notes  [facility] [address], [place] [phone] Accepts children up to age 28 who are enrolled in [place]icaid or [place] Health Choice; pregnant women with a Medicaid card; and children who have applied for Medicaid or [place] Health Choice, but were declined, whose parents can pay a reduced fee at time of service.  [facility] [place]  [address], [place] [phone] Accepts children up to age 28 who are enrolled in [place]icaid or [place] Health Choice; pregnant women with a Medicaid card; and children who have applied for Medicaid or [place] Health Choice, but were declined, whose parents can pay a reduced fee at time of service.  [facility]  [address], [place] [phone] Patients are seen by appointment only. Walk-ins are not accepted. [facility] will see patients 28 years of age and older. Monday - Tuesday (8am-5pm) Most Wednesdays (8:30-5pm) $30 per visit, cash only  [facility]  [address], [facility] [phone] Patients are seen by appointment only. Walk-ins are not accepted. [facility] will see patients 28 years of age and older. One Wednesday Evening (Monthly: Volunteer Based).  $30 per visit, cash only  [facility]  [phone] for adults; Children under age 264, call Graduate Pediatric Dentistry at [phone]. Children aged 374-14, please call [phone] to request a  pediatric application.  Dental services are provided in all areas of dental care including fillings, crowns and bridges, complete and partial dentures, implants, gum treatment, root canals, and extractions. Preventive care is also provided. Treatment is provided to both adults and children. Patients are selected via a lottery and there is often a waiting list.   [facility] [address], [place]  [phone] [email or web address]   [facility] [address], [place], [place] [phone], Ext. [phone] Second and Fourth Thursday of each month, opens at 6:30 AM; Clinic ends at 9 AM.  Patients are seen on a first-come first-served basis, and a limited number are seen during each clinic.   [facility]  [address], [place], [place] [phone]) [phone]   Eligibility Requirements You must have lived in [place], [place], or [place] counties for at least the last three months.   You cannot be eligible for state or federal sponsored National Cityhealthcare insurance, including CIGNAVeterans Administration, [place]icaid, or [employer] EntertainmentMedicare.   You generally cannot be eligible for healthcare insurance through your employer.    How to apply: Eligibility screenings are held every Tuesday and Wednesday afternoon from 1:00 pm until 4:00 pm. You do not need an appointment for the interview!  [facility] [address], [place], [place] [phone]   [facility]  [phone]   [facility]  [phone]   [facility]  [phone]    Behavioral Health Resources in the Community: Intensive Outpatient Programs Organization         Address  Phone  Notes  [facility] [address], [place], [place] [phone]   [facility] [address], [place], [place] [phone]   [facility] [address], [place], [place]  [phone]   [facility] [address],  [place], [place] [phone] or [phone]   Substance Abuse Resources Organization         Address  Phone  Notes  [facility]  [phone]   [facility]  [phone]   [facility]  [phone]   [facility]  [phone]   Residential & Outpatient Substance Abuse Program  [phone]   Psychological Services Organization         Address  Phone  Notes  [facility]  [phone]- [phone]   [facility]  [phone]- [phone]   [facility] [address], [place] [phone] or [phone]    Mobile Crisis Teams Organization         Address  Phone  Notes  [facility], Mobile Crisis Care Unit  [phone]   [facility]  [address] [place], [place] [phone]   Doristine LocksSharon DeEsch [address] [place] [phone]    Self-Help/Support Groups Organization         Address  Phone             Notes  [facility] of [place] - variety of support groups  [phone]- [phone] Call for more information  Narcotics Anonymous (NA), [facility] [address], [place]   2 meetings at this location   Statisticianesidential Treatment Programs Organization         Address  Phone  Notes  [facility] [address],    [place]  [phone]   [facility]  [address], [place] [phone], [place], [place] [phone]   [facility] [address], [place] [phone] Admissions: 8am-3pm M-F  [facility] [address]    [place], [place] [phone]   [facility] [address], [place], [place] [phone]   [facility] [address]  [place], [place] [phone]   [facility] - Intensive Outpatient [address], [place], [place] [phone]   [facility] [address]    [place], [place] [phone] or [phone]   [facility] ([facility]) [address] [place], [place] [phone] Accepts Medicaid  [facility] [address]  [place] [phone] Substance Abuse/Addiction Treatment   [facility] Behavioral Health Resources Organization         Address  Phone  Notes  [facility]  [phone]   Angie FavaJulie Brannon, PhD [address], [address] [place], [place]   [phone] or [phone]) [phone]   [facility]   [address] [place], [place] [phone]) [phone]   [facility] [address], [place], [place] [phone]) [phone] Insurance/Medicaid/sponsorship through [facility]  [facility] [address]                                    [place], [place] [phone]) [phone] Therapy/tele-psych/case  [facility] [address]   [place], [place] [phone]    Dr. Lolly MustacheArfeen  [phone]   [facility]  United  [place] Dept. 1) [address] [place] [address] [place] 2) [place] 3)  [place], [place] [phone]  [phone]   [place] [phone] or [phone] (After Hours)

## 2014-10-07 NOTE — ED Provider Notes (Signed)
28 year old female, no past surgical history complains of abdominal pain in the lower abdomen especially the suprapubic region. On exam the patient has reproducible tenderness in the lower abdomen, no pain at McBurney's point that has tenderness suprapubic and trends towards right and left lower quadrant. There is no upper abdominal tenderness and no Murphy sign. The patient will need further evaluation with pelvic exam, check urinalysis, labs, possible CT scan to rule out appendicitis or other intra-abdominal pathology. Pain medication ordered intravenously.  Medical screening examination/treatment/procedure(s) were conducted as a shared visit with non-physician practitioner(s) and myself.  I personally evaluated the patient during the encounter.  Clinical Impression:   Final diagnoses:  Abdominal pain  Bacterial vaginosis  Vaginal discharge  Concern about STD in female without diagnosis         Vida RollerBrian D Charlise Giovanetti, MD 10/08/14 [phone]

## 2014-10-07 NOTE — ED Provider Notes (Signed)
CSN: [id number]     Arrival date & time 10/07/14  1614 History   First MD Initiated Contact with Patient 10/07/14 1629     Chief Complaint  Patient presents with  . Abdominal Pain   Kimberly Yoder is a 28 y.o. female G0P0 presents to the ED complaining of lower abdominal pain for the past 3 days with nausea and one episode of vomiting. She also reports some malodorous and darker urine. She describes the superpubic pain is throbbing 8 out of 10. First the pain is better in the fetal position. She has not attempted any other treatments. She reports gradual onset associated with nausea. Reports vomiting once this morning and reports of burgundy color to her vomitus. Her last LMP was 09/23/2014 and was normal. She reports having normal regular menstrual cycles. She denies previous abdominal surgeries. She is sexually active and is not using protection. She is not on birth control. She does report having a dry cough for a week. She denies history of previous STDs. She denies fevers, chills, sore throat, chest pain, diarrhea, hematochezia, hematuria, dysuria, urinary urgency, lesions or rashes on her genitals.  (Consider location/radiation/quality/duration/timing/severity/associated sxs/prior Treatment) Patient is a 28 y.o. female presenting with abdominal pain. The history is provided by the patient.  Abdominal Pain Pain location:  Suprapubic, LLQ and RLQ Pain quality: throbbing   Pain radiates to:  Does not radiate Pain severity:  Severe Onset quality:  Gradual Duration:  3 days Timing:  Constant Progression:  Worsening Chronicity:  New Context: recent sexual activity   Context: not previous surgeries, not recent illness, not sick contacts, not suspicious food intake and not trauma   Relieved by:  Nothing Worsened by:  Palpation Ineffective treatments:  None tried Associated symptoms: nausea and vomiting   Associated symptoms: no chest pain, no chills, no constipation, no cough, no diarrhea,  no dysuria, no fatigue, no fever, no hematochezia, no hematuria, no shortness of breath, no sore throat and no vaginal bleeding   Risk factors: no alcohol abuse and has not had multiple surgeries     History reviewed. No pertinent past medical history. History reviewed. No pertinent past surgical history. No family history on file. History  Substance Use Topics  . Smoking status: Light Tobacco Smoker  . Smokeless tobacco: Not on file  . Alcohol Use: Yes     Comment: occ   OB History    No data available     Review of Systems  Constitutional: Negative for fever, chills and fatigue.  HENT: Negative for congestion, ear pain, mouth sores and sore throat.   Eyes: Negative for pain and visual disturbance.  Respiratory: Negative for cough, shortness of breath and wheezing.   Cardiovascular: Negative for chest pain, palpitations and leg swelling.  Gastrointestinal: Positive for nausea, vomiting and abdominal pain. Negative for diarrhea, constipation, blood in stool and hematochezia.  Genitourinary: Positive for frequency. Negative for dysuria, urgency, hematuria, flank pain, decreased urine volume and vaginal bleeding.  Musculoskeletal: Negative for myalgias, back pain and neck pain.  Skin: Negative for rash.  Neurological: Negative for dizziness, syncope, weakness, light-headedness and headaches.  All other systems reviewed and are negative.     Allergies  Review of patient's allergies indicates no known allergies.  Home Medications   Prior to Admission medications   Medication Sig Start Date End Date Taking? Authorizing Provider  albuterol (PROVENTIL HFA;VENTOLIN HFA) 108 (90 BASE) MCG/ACT inhaler Inhale 1-2 puffs into the lungs every 6 (six) hours as needed for  wheezing or shortness of breath.    Historical Provider, MD  famotidine (PEPCID) 20 MG tablet Take 1 tablet (20 mg total) by mouth 2 (two) times daily. 05/13/14   Tatyana A Kirichenko, PA-C  HYDROcodone-acetaminophen  (NORCO/VICODIN) 5-325 MG per tablet Take 1 tablet by mouth every 4 (four) hours as needed for moderate pain or severe pain. 10/07/14   Lawana Chambers, PA  hydrOXYzine (ATARAX/VISTARIL) 25 MG tablet Take 1 tablet (25 mg total) by mouth every 4 (four) hours as needed. 05/13/14   Tatyana A Kirichenko, PA-C  ibuprofen (ADVIL,MOTRIN) 800 MG tablet Take 800 mg by mouth every 8 (eight) hours as needed (for pain.).    Historical Provider, MD  metroNIDAZOLE (FLAGYL) 500 MG tablet Take 1 tablet (500 mg total) by mouth 2 (two) times daily. 10/07/14   Lawana Chambers, PA  predniSONE (DELTASONE) 20 MG tablet Take 2 tablets (40 mg total) by mouth daily. 05/13/14   Tatyana A Kirichenko, PA-C  promethazine (PHENERGAN) 25 MG tablet Take 1 tablet (25 mg total) by mouth every 6 (six) hours as needed for nausea or vomiting. 10/07/14   Einar Gip Lesette Frary, PA   BP 145/78 mmHg  Pulse 89  Temp(Src) 98 F (36.7 C) (Oral)  Resp 20  SpO2 98%  LMP 09/23/2014 (Exact Date) Physical Exam  Constitutional: She appears well-developed and well-nourished. No distress.  HENT:  Head: Normocephalic and atraumatic.  Mouth/Throat: Oropharynx is clear and moist. No oropharyngeal exudate.  Eyes: Conjunctivae are normal. Pupils are equal, round, and reactive to light. Right eye exhibits no discharge. Left eye exhibits no discharge.  Neck: Neck supple.  Cardiovascular: Normal rate, regular rhythm, normal heart sounds and intact distal pulses.  Exam reveals no gallop and no friction rub.   No murmur heard. Pulmonary/Chest: Effort normal and breath sounds normal. No respiratory distress. She has no wheezes. She has no rales.  Abdominal: Soft. Bowel sounds are normal. She exhibits no distension and no mass. There is no hepatosplenomegaly. There is tenderness. There is rebound, guarding and tenderness at McBurney's point. There is no rigidity, no CVA tenderness and negative Murphy's sign. Hernia confirmed negative in the right  inguinal area and confirmed negative in the left inguinal area.  Patient right lower quadrant tenderness with rebound tenderness. Positive Rovsing sign, positive psoas sign, positive obturator sign. Negative Murphy's sign. No organomegaly noted.  Genitourinary: There is no rash, tenderness, lesion or injury on the right labia. There is no rash, tenderness, lesion or injury on the left labia. Cervix exhibits no motion tenderness. Right adnexum displays tenderness. Right adnexum displays no mass. Left adnexum displays tenderness. Left adnexum displays no mass. No bleeding in the vagina. No foreign body around the vagina. Vaginal discharge found.  Pelvic exam performed by me with female chaperone present. Patient with white vaginal discharge. No cervical motion tenderness. Some bilateral adnexal tenderness without fullness on exam. No vaginal bleeding. Cervix is closed.  Musculoskeletal: She exhibits no edema.  Lymphadenopathy:    She has no cervical adenopathy.  Neurological: She is alert. Coordination normal.  Skin: Skin is warm and dry. No rash noted. She is not diaphoretic. No erythema. No pallor.  Psychiatric: She has a normal mood and affect. Her behavior is normal.  Nursing note and vitals reviewed.   ED Course  Procedures (including critical care time) Labs Review Labs Reviewed  WET PREP, GENITAL - Abnormal; Notable for the following:    Clue Cells Wet Prep HPF POC FEW (*)  All other components within normal limits  URINALYSIS, ROUTINE W REFLEX MICROSCOPIC - Abnormal; Notable for the following:    APPearance CLOUDY (*)    All other components within normal limits  COMPREHENSIVE METABOLIC PANEL - Abnormal; Notable for the following:    Glucose, Bld 100 (*)    Total Protein 8.4 (*)    All other components within normal limits  GC/CHLAMYDIA PROBE AMP  PREGNANCY, URINE  LIPASE, BLOOD  CBC WITH DIFFERENTIAL  HIV ANTIBODY (ROUTINE TESTING)  RPR    Imaging Review Ct Abdomen Pelvis  W Contrast  10/07/2014   CLINICAL DATA:  Two day history of lower abdominal and pelvic pain  EXAM: CT ABDOMEN AND PELVIS WITH CONTRAST  TECHNIQUE: Multidetector CT imaging of the abdomen and pelvis was performed using the standard protocol following bolus administration of intravenous contrast. Oral contrast was also administered.  CONTRAST:  100mL OMNIPAQUE IOHEXOL 300 MG/ML  SOLN  COMPARISON:  July 26, 2010  FINDINGS: There is minimal scarring in the lung bases. Lung bases are otherwise clear.  Liver is prominent, measuring 17.3 cm in length. There is hepatic steatosis. There is a tiny cyst in the anterior segment right lobe of the liver. No other focal liver lesion identified. Gallbladder wall is not thickened. There is no biliary duct dilatation.  Spleen, pancreas, and adrenals appear normal. Kidneys bilaterally show no mass or hydronephrosis on either side. There is no renal or ureteral calculus on either side.  In the pelvis, urinary bladder is midline with normal wall thickness. There is a dominant follicle arising from the right ovary measuring 2.2 x 1.6 cm. Beyond this dominant follicle, there is no appreciable pelvic mass. There is no pelvic fluid.  The appendix is not well seen. There is no periappendiceal region inflammation. The terminal ileum appears normal.  There is no bowel obstruction. No free air or portal venous air. There is no appreciable ascites, adenopathy, or abscess in the abdomen or pelvis. There is no demonstrable abdominal aortic aneurysm. There are no blastic or lytic bone lesions.  IMPRESSION: Prominent liver with hepatic steatosis.  No bowel obstruction. No abscess. Periappendiceal region appears unremarkable. No renal or ureteral calculus. No hydronephrosis.   Electronically Signed   By: Bretta BangWilliam  Woodruff M.D.   On: 10/07/2014 19:30     EKG Interpretation None      Filed Vitals:   10/07/14 1627 10/07/14 1833 10/07/14 1944  BP: 129/88 130/87 145/78  Pulse: 87 87 89   Temp: 98.7 F (37.1 C)  98 F (36.7 C)  TempSrc: Oral    Resp: 20 18 20   SpO2: 100% 100% 98%     MDM   Meds given in ED:  Medications  ondansetron (ZOFRAN) injection 4 mg (4 mg Intravenous Given 10/07/14 1737)  sodium chloride 0.9 % bolus 1,000 mL (0 mLs Intravenous Stopped 10/07/14 2007)  morphine 4 MG/ML injection 4 mg (4 mg Intravenous Given 10/07/14 1738)  iohexol (OMNIPAQUE) 300 MG/ML solution 100 mL (100 mLs Intravenous Contrast Given 10/07/14 1901)  azithromycin (ZITHROMAX) tablet 1,000 mg (1,000 mg Oral Given 10/07/14 2019)  cefTRIAXone (ROCEPHIN) injection 250 mg (250 mg Intramuscular Given 10/07/14 2019)    New Prescriptions   HYDROCODONE-ACETAMINOPHEN (NORCO/VICODIN) 5-325 MG PER TABLET    Take 1 tablet by mouth every 4 (four) hours as needed for moderate pain or severe pain.   METRONIDAZOLE (FLAGYL) 500 MG TABLET    Take 1 tablet (500 mg total) by mouth 2 (two) times daily.   PROMETHAZINE (  PHENERGAN) 25 MG TABLET    Take 1 tablet (25 mg total) by mouth every 6 (six) hours as needed for nausea or vomiting.    Final diagnoses:  Abdominal pain  Bacterial vaginosis  Vaginal discharge  Concern about STD in female without diagnosis   Bettyann D Lukin ports the ED with low abdominal pain for the past 3 daystissue is some malodorous urine and recent sexual activity without using protection. Her CMP, lipase and CBC are unremarkable. She has a negative urine pregnancy test. Her urinalysis is remarkable only for cloudy urine. She had malodorous white discharge on pelvic exam. She had no cervical motion tenderness. Her wet prep returned with clue cells. We'll diagnosed with bacterial vaginosis. Will treat with Rocephin and azithromycin in the ED and sent her home with Flagyl. As patient not to drink alcohol while taking Flagyl. Last use caution while taking narcotics for pain control. Advised patient to follow-up with the Health department in 1 week. Advised the patient to return to the  ED with new worsening symptoms or new concerns. Patient verbalized understanding and agreement with plan.  Patient discussed with and evaluated by Dr. Hyacinth Meeker who agrees with treatment and plan.    Lawana Chambers, PA 10/07/14 2030  Vida Roller, MD 10/08/14 [phone]

## 2014-10-07 NOTE — Progress Notes (Signed)
  CARE MANAGEMENT ED NOTE 10/07/2014  Patient:  Kimberly Yoder,Kimberly Yoder   Account Number:  [id number]  Date Initiated:  10/07/2014  Documentation initiated by:  Radford PaxFERRERO,Virgina Deakins  Subjective/Objective Assessment:   Patient admitted to ED with abdominal pain, foul smelling urine.     Subjective/Objective Assessment Detail:     Action/Plan:   Action/Plan Detail:   Anticipated DC Date:  10/07/2014     Status Recommendation to Physician:   Result of Recommendation:    Other ED Services  Consult Working Plan    DC Planning Services  Other  PCP issues    Choice offered to / List presented to:            Status of service:  Completed, signed off  ED Comments:   ED Comments Detail:  EDCM spoke to patient ta bedside.  Patient listed as having [employer] insurnace.  Patinet reports she sees Dr. Concepcion ElkAvbuere for GYN needs only, not as pcp.    EDCM [facility], informed patient of services there and walk in times.  EDCM also provided patient with list of pcps who accept self pay patients, list of discount pharmacies and websites needymeds.org and GoodRX.com for medication assistance, phone number to inquire about the orange card, phone number to inquire about [employer], phone number to inquire about the Affordable Care Act, financial resources in the community such as local churches, [employer], urban ministries, and dental assistance for uninsured patients.  Patient reports she does not have any disfficulty affording her medications.  [facility] with [facility].  Patient also [facility]able  [facility] as [facility]. Discussed with EDP. Patient thankful for assistance.  No further EDCM needs at this time.

## 2014-10-07 NOTE — ED Notes (Addendum)
Pt c/o lower abd pain x 3 days, states this morning she vomited blood, c/o nausea at the moment. Denies vaginal d/c or bleeding.

## 2014-10-08 LAB — GC/CHLAMYDIA PROBE AMP
CT PROBE, AMP APTIMA: NEGATIVE
GC Probe RNA: NEGATIVE

## 2014-10-08 LAB — HIV ANTIBODY (ROUTINE TESTING W REFLEX): HIV: NONREACTIVE

## 2016-07-21 ENCOUNTER — Emergency Department (HOSPITAL_COMMUNITY)
Admission: EM | Admit: 2016-07-21 | Discharge: 2016-07-21 | Disposition: A | Discharge status: Home / Self Care | Payer: Medicaid Other | Attending: Emergency Medicine | Admitting: Emergency Medicine

## 2016-07-21 ENCOUNTER — Emergency Department (HOSPITAL_COMMUNITY): Payer: Medicaid Other

## 2016-07-21 ENCOUNTER — Encounter (HOSPITAL_COMMUNITY): Payer: Self-pay | Admitting: Emergency Medicine

## 2016-07-21 DIAGNOSIS — Z791 Long term (current) use of non-steroidal anti-inflammatories (NSAID): Secondary | ICD-10-CM | POA: Insufficient documentation

## 2016-07-21 DIAGNOSIS — F17200 Nicotine dependence, unspecified, uncomplicated: Secondary | ICD-10-CM | POA: Insufficient documentation

## 2016-07-21 DIAGNOSIS — M533 Sacrococcygeal disorders, not elsewhere classified: Secondary | ICD-10-CM

## 2016-07-21 LAB — POC URINE PREG, ED: Preg Test, Ur: NEGATIVE

## 2016-07-21 MED ORDER — NAPROXEN 500 MG PO TABS: 500.0000 mg | ORAL_TABLET | Freq: Two times a day (BID) | ORAL | 0 refills | Status: DC | PRN

## 2016-07-21 MED ORDER — HYDROCODONE-ACETAMINOPHEN 5-325 MG PO TABS
1.0000 | ORAL_TABLET | Freq: Once | ORAL | Status: AC
  Administered 2016-07-21: 1 via ORAL
  Filled 2016-07-21: qty 1

## 2016-07-21 MED ORDER — HYDROCODONE-ACETAMINOPHEN 5-325 MG PO TABS: 1.0000 | ORAL_TABLET | Freq: Four times a day (QID) | ORAL | 0 refills | Status: DC | PRN

## 2016-07-21 MED ORDER — IBUPROFEN 800 MG PO TABS
800.0000 mg | ORAL_TABLET | Freq: Once | ORAL | Status: AC
  Administered 2016-07-21: 800 mg via ORAL
  Filled 2016-07-21: qty 1

## 2016-07-21 NOTE — ED Provider Notes (Signed)
[facility]-EMERGENCY DEPT Provider Note   CSN: [id number] Arrival date & time: 07/21/16  1846  By signing my name below, I, Modena JanskyAlbert Thayil, attest that this documentation has been prepared under the direction and in the presence of non-physician practitioner, [id number] Mechan[address] CourtMercedes Saphyre Cillo, PA-C. Electronically Signed: Modena JanskyAlbert Thayil, Scribe. 07/21/2016. 7:47 PM.  History   Chief Complaint Chief Complaint  Patient presents with  . Tailbone Pain   The history is provided by the patient and medical records. No language interpreter was used.  Back Pain   This is a new problem. The current episode started more than 2 days ago. The problem occurs constantly. The problem has been gradually worsening. The pain is associated with no known injury. Pain location: Tailbone. Quality: Sore. The pain does not radiate. The pain is at a severity of 8/10. The pain is moderate. The symptoms are aggravated by certain positions. The pain is the same all the time. Pertinent negatives include no chest pain, no fever, no numbness (No saddle anesthesia), no abdominal pain, no bowel incontinence, no perianal numbness, no bladder incontinence, no dysuria, no paresthesias, no tingling and no weakness. She has tried NSAIDs for the symptoms. The treatment provided mild relief.   HPI Comments: Kimberly Yoder is a 30 y.o. female who presents to the Emergency Department complaining of tailbone pain that started 3 days ago. She states that she woke up with the pain without any known injury, and it has gradually worsened. She has not had this pain in the past, and denies any prolonged sitting prior to onset of the pain. Pt describes the pain as 8/10 constant, sore, non-radiating tailbone pain, exacerbated by sitting and laying down, and somewhat relieved by ibuprofen. She denies any hx of CA or IV drug use. She also denies any recent long periods of sitting, trauma/injury, saddle anesthesia or cauda equina symptoms, bowel/bladder incontinence, rectal  pain, fever, chills, CP, SOB, abd pain, n/v/d/c, dysuria, hematuria, rectal bleeding/skin changes, numbness/tingling, weakness, rashes, overlying skin changes, redness, warmth, or swelling. LMP 07/03/16. NKDA.   History reviewed. No pertinent past medical history.  There are no active problems to display for this patient.   History reviewed. No pertinent surgical history.  OB History    No data available       Home Medications    Prior to Admission medications   Medication Sig Start Date End Date Taking? Authorizing Provider  albuterol (PROVENTIL HFA;VENTOLIN HFA) 108 (90 BASE) MCG/ACT inhaler Inhale 1-2 puffs into the lungs every 6 (six) hours as needed for wheezing or shortness of breath.    Historical Provider, MD  famotidine (PEPCID) 20 MG tablet Take 1 tablet (20 mg total) by mouth 2 (two) times daily. 05/13/14   Tatyana Kirichenko, PA-C  HYDROcodone-acetaminophen (NORCO/VICODIN) 5-325 MG per tablet Take 1 tablet by mouth every 4 (four) hours as needed for moderate pain or severe pain. 10/07/14   Everlene FarrierWilliam Dansie, PA-C  hydrOXYzine (ATARAX/VISTARIL) 25 MG tablet Take 1 tablet (25 mg total) by mouth every 4 (four) hours as needed. 05/13/14   Tatyana Kirichenko, PA-C  ibuprofen (ADVIL,MOTRIN) 800 MG tablet Take 800 mg by mouth every 8 (eight) hours as needed (for pain.).    Historical Provider, MD  metroNIDAZOLE (FLAGYL) 500 MG tablet Take 1 tablet (500 mg total) by mouth 2 (two) times daily. 10/07/14   Everlene FarrierWilliam Dansie, PA-C  predniSONE (DELTASONE) 20 MG tablet Take 2 tablets (40 mg total) by mouth daily. 05/13/14   Tatyana Kirichenko, PA-C  promethazine (PHENERGAN) 25 MG  tablet Take 1 tablet (25 mg total) by mouth every 6 (six) hours as needed for nausea or vomiting. 10/07/14   Everlene FarrierWilliam Dansie, PA-C    Family History Family History  Problem Relation Age of Onset  . CAD Other     Social History Social History  Substance Use Topics  . Smoking status: Light Tobacco Smoker  . Smokeless  tobacco: Never Used  . Alcohol use Yes     Comment: occ     Allergies   Review of patient's allergies indicates no known allergies.   Review of Systems Review of Systems  Constitutional: Negative for chills and fever.  Respiratory: Negative for shortness of breath.   Cardiovascular: Negative for chest pain.  Gastrointestinal: Negative for abdominal pain, bowel incontinence, constipation, diarrhea, nausea, rectal pain and vomiting.  Genitourinary: Negative for bladder incontinence, difficulty urinating (No bowel/bladder incontinence), dysuria, hematuria, vaginal bleeding and vaginal discharge.  Musculoskeletal: Positive for back pain (tailbone). Negative for arthralgias and myalgias.  Skin: Negative for color change and rash.  Allergic/Immunologic: Negative for immunocompromised state.  Neurological: Negative for tingling, weakness, numbness (No saddle anesthesia) and paresthesias.  Psychiatric/Behavioral: Negative for confusion.  10 Systems reviewed and are negative for acute change except as noted in the HPI.    Physical Exam Updated Vital Signs BP 128/78   Pulse 95   Temp 98.9 F (37.2 C) (Oral)   Resp 18   LMP 07/03/2016 (Approximate)   SpO2 99%   Physical Exam  Constitutional: She is oriented to person, place, and time. Vital signs are normal. She appears well-developed and well-nourished.  Non-toxic appearance. No distress.  Afebrile, nontoxic, NAD  HENT:  Head: Normocephalic and atraumatic.  Mouth/Throat: Mucous membranes are normal.  Eyes: Conjunctivae and EOM are normal. Right eye exhibits no discharge. Left eye exhibits no discharge.  Neck: Normal range of motion. Neck supple.  Cardiovascular: Normal rate and intact distal pulses.   Pulmonary/Chest: Effort normal. No respiratory distress.  Abdominal: Normal appearance. She exhibits no distension.  Genitourinary:  Genitourinary Comments: Chaperone present during exam, no gluteal or anal opening skin changes, no  abscesses/cellulitis noted to the buttock/anal region.   Musculoskeletal: Normal range of motion.       Back:  Coccyx region with focal TTP to the midline of the tailbone just above the intergluteal cleft, no bony stepoffs or deformities, no lumbar midline spinous process tenderness, no paraspinous muscle TTP or muscle spasms, with no overlying skin changes, no palpable pilonidal cyst, no erythema or warmth, no drainage, no abscess or cellulitis noted. Strength 5/5 in all extremities, sensation grossly intact in all extremities, gait steady and nonantalgic. Distal pulses intact.  Neurological: She is alert and oriented to person, place, and time. She has normal strength. No sensory deficit.  Skin: Skin is warm, dry and intact. No rash noted.  Psychiatric: She has a normal mood and affect. Her behavior is normal.  Nursing note and vitals reviewed.   ED Treatments / Results  DIAGNOSTIC STUDIES: Oxygen Saturation is 99% on RA, normal by my interpretation.    COORDINATION OF CARE: 7:52 PM- Pt advised of plan for treatment and pt agrees.  Labs (all labs ordered are listed, but only abnormal results are displayed) Labs Reviewed  POC URINE PREG, ED    EKG  EKG Interpretation None       Radiology Dg Sacrum/coccyx  Result Date: 07/21/2016 CLINICAL DATA:  Sacrococcygeal pain for 3 days. No known injury. No radiation. Initial encounter. Evaluate for deformity. EXAM:  SACRUM AND COCCYX - 2+ VIEW COMPARISON:  CT reformats 10/07/2014 FINDINGS: There is no evidence of fracture or other focal bone lesions. Sacroiliac joints are symmetric and normal. Sacral ala are maintained. IMPRESSION: Negative radiographs of the sacrum and coccyx. Electronically Signed   By: Rubye Oaks M.D.   On: 07/21/2016 21:28    Procedures Procedures (including critical care time)  Medications Ordered in ED Medications  HYDROcodone-acetaminophen (NORCO/VICODIN) 5-325 MG per tablet 1 tablet (1 tablet Oral Given  07/21/16 1957)  ibuprofen (ADVIL,MOTRIN) tablet 800 mg (800 mg Oral Given 07/21/16 2114)     Initial Impression / Assessment and Plan / ED Course  I have reviewed the triage vital signs and the nursing notes.  Pertinent labs & imaging results that were available during my care of the patient were reviewed by me and considered in my medical decision making (see chart for details).  Clinical Course    30 y.o. female here with pain in her tailbone x3 days, no known injury/trauma, states it feels like a bruise. Denies prolonged sitting recently. On exam, focal tenderness at the end of the coccyx just above the gluteal cleft, no definite pilonidal cyst palpable, no skin changes or abscess/cellulitis, anal opening without any skin findings. Discussed that pilonidal cysts can sometimes become painful without actually having abscess/infection, and this could be the cause of her symptoms, but will obtain xray to ensure no bony findings on xray. Will give pain meds and reassess shortly. Upreg ordered per radiology request, doubt need for other labs/imaging. Will reassess shortly.   9:32 PM Xray neg, and no obvious cyst/abscess pocket seen near the coccyx on xray imaging. Upreg neg. Pain was still ongoing so she requested ibuprofen, now feels slightly better. Discussed that there's a possibility that this is a pilonidal cyst, but there is no evidence of infection/abscess so doubt need for abx at this time. May just be muscular/ligamentous/tendonous strain of things that attach to coccyx. Discussed avoidance of prolonged sitting or hitting the tailbone area. Pain meds given. Discussed use of heat. F/up with PCP in 5-7 days for recheck and ongoing management. Doubt need surgeon referral now, but if this continues/persists and pilonidal cyst definitively develops, then she may warrant surgeon f/up at that time. Will leave that up to PCP discretion. I explained the diagnosis and have given explicit precautions to  return to the ER including for any other new or worsening symptoms. The patient understands and accepts the medical plan as it's been dictated and I have answered their questions. Discharge instructions concerning home care and prescriptions have been given. The patient is STABLE and is discharged to home in good condition.   Final Clinical Impressions(s) / ED Diagnoses   Final diagnoses:  Coccyx pain    New Prescriptions New Prescriptions   HYDROCODONE-ACETAMINOPHEN (NORCO) 5-325 MG TABLET    Take 1 tablet by mouth every 6 (six) hours as needed for severe pain.   NAPROXEN (NAPROSYN) 500 MG TABLET    Take 1 tablet (500 mg total) by mouth 2 (two) times daily as needed for mild pain, moderate pain or headache (TAKE WITH MEALS.).   I personally performed the services described in this documentation, which was scribed in my presence. The recorded information has been reviewed and is accurate.     Sybil Shrader Camprubi-Soms, PA-C 07/21/16 2134    Mancel Bale, MD 07/22/16 [phone]

## 2016-07-21 NOTE — ED Triage Notes (Signed)
Pt is c/o coccyx pain  Denies injury  States she woke Sunday with pain and it has progressively gotten worse  States pain is a 4 standing and a 8 sitting

## 2016-07-21 NOTE — Discharge Instructions (Signed)
Your pain could be from a muscular or ligamentous injury of the muscles/ligaments/tendons that attach to the tailbone, or it could potentially be related to something called a pilonidal cyst, which is a small pocket of tissue near the tailbone that can occasionally become irritated. Sometimes these cysts can become infected, but today this does not appear to be the case. Occasionally pilonidal cysts will need surgery to remove them. Use heat to the area of soreness, no more than 20 minutes per hour, and avoid prolonged sitting or hitting the tailbone area. Use naprosyn and norco as directed as needed for pain but don't drive while taking norco. Follow up with your regular doctor in the next 5-7 days for recheck of symptoms and ongoing evaluation of this pain. They can refer you to a surgeon if it ends up being a pilonidal cyst, but for now it doesn't appear that you definitively have a cyst. Return to the ER for changes or worsening symptoms.

## 2016-07-21 NOTE — ED Notes (Signed)
Patient was alert, oriented and stable upon discharge. RN went over AVS and patient had no further questions.  

## 2017-01-29 ENCOUNTER — Encounter (HOSPITAL_COMMUNITY): Payer: Self-pay | Admitting: *Deleted

## 2017-01-29 ENCOUNTER — Emergency Department (HOSPITAL_COMMUNITY)
Admission: EM | Admit: 2017-01-29 | Discharge: 2017-01-29 | Disposition: A | Discharge status: Home / Self Care | Payer: Medicaid Other | Attending: Emergency Medicine | Admitting: Emergency Medicine

## 2017-01-29 DIAGNOSIS — F17200 Nicotine dependence, unspecified, uncomplicated: Secondary | ICD-10-CM | POA: Insufficient documentation

## 2017-01-29 DIAGNOSIS — L0231 Cutaneous abscess of buttock: Secondary | ICD-10-CM

## 2017-01-29 MED ORDER — CLINDAMYCIN HCL 150 MG PO CAPS: 300.0000 mg | ORAL_CAPSULE | Freq: Three times a day (TID) | ORAL | 0 refills | Status: DC

## 2017-01-29 MED ORDER — CLINDAMYCIN HCL 300 MG PO CAPS
300.0000 mg | ORAL_CAPSULE | Freq: Once | ORAL | Status: AC
  Administered 2017-01-29: 300 mg via ORAL
  Filled 2017-01-29: qty 1

## 2017-01-29 MED ORDER — IBUPROFEN 800 MG PO TABS: 800.0000 mg | ORAL_TABLET | Freq: Three times a day (TID) | ORAL | 0 refills | Status: DC

## 2017-01-29 MED ORDER — OXYCODONE-ACETAMINOPHEN 5-325 MG PO TABS
1.0000 | ORAL_TABLET | Freq: Once | ORAL | Status: AC
  Administered 2017-01-29: 1 via ORAL
  Filled 2017-01-29: qty 1

## 2017-01-29 NOTE — ED Provider Notes (Signed)
[facility]-EMERGENCY DEPT Provider Note   CSN: [id number] Arrival date & time: 01/29/17  0825     History   Chief Complaint Chief Complaint  Patient presents with  . Abscess    HPI Kimberly Yoder is a 31 y.o. female.  The history is provided by the patient and medical records.  Abscess   31 year old female here with abscess of the right buttock. Reports she first started noticing this 2 days ago but has gotten bigger over the last day and half. She reports area is very tender to palpation, worse when sitting upright on her buttocks. She denies any drainage. No fever or chills. Reports history of same in the past, however there was no abscess at that time. She has not tried any medications or intervention prior to arrival.  No hx of IVDU, cancer, MRSA, HIV, or DM.  History reviewed. No pertinent past medical history.  There are no active problems to display for this patient.   History reviewed. No pertinent surgical history.  OB History    No data available       Home Medications    Prior to Admission medications   Medication Sig Start Date End Date Taking? Authorizing Provider  albuterol (PROVENTIL HFA;VENTOLIN HFA) 108 (90 BASE) MCG/ACT inhaler Inhale 1-2 puffs into the lungs every 6 (six) hours as needed for wheezing or shortness of breath.    Historical Provider, MD  famotidine (PEPCID) 20 MG tablet Take 1 tablet (20 mg total) by mouth 2 (two) times daily. 05/13/14   Tatyana Kirichenko, PA-C  HYDROcodone-acetaminophen (NORCO) 5-325 MG tablet Take 1 tablet by mouth every 6 (six) hours as needed for severe pain. 07/21/16   Mercedes Street, PA-C  HYDROcodone-acetaminophen (NORCO/VICODIN) 5-325 MG per tablet Take 1 tablet by mouth every 4 (four) hours as needed for moderate pain or severe pain. 10/07/14   Everlene FarrierWilliam Dansie, PA-C  hydrOXYzine (ATARAX/VISTARIL) 25 MG tablet Take 1 tablet (25 mg total) by mouth every 4 (four) hours as needed. 05/13/14   Tatyana Kirichenko, PA-C  ibuprofen  (ADVIL,MOTRIN) 800 MG tablet Take 800 mg by mouth every 8 (eight) hours as needed (for pain.).    Historical Provider, MD  metroNIDAZOLE (FLAGYL) 500 MG tablet Take 1 tablet (500 mg total) by mouth 2 (two) times daily. 10/07/14   Everlene FarrierWilliam Dansie, PA-C  naproxen (NAPROSYN) 500 MG tablet Take 1 tablet (500 mg total) by mouth 2 (two) times daily as needed for mild pain, moderate pain or headache (TAKE WITH MEALS.). 07/21/16   Mercedes Street, PA-C  predniSONE (DELTASONE) 20 MG tablet Take 2 tablets (40 mg total) by mouth daily. 05/13/14   Tatyana Kirichenko, PA-C  promethazine (PHENERGAN) 25 MG tablet Take 1 tablet (25 mg total) by mouth every 6 (six) hours as needed for nausea or vomiting. 10/07/14   Everlene FarrierWilliam Dansie, PA-C    Family History Family History  Problem Relation Age of Onset  . CAD Other     Social History Social History  Substance Use Topics  . Smoking status: Light Tobacco Smoker  . Smokeless tobacco: Never Used  . Alcohol use Yes     Comment: occ     Allergies   Patient has no known allergies.   Review of Systems Review of Systems  Skin:       Abscess, R buttock  All other systems reviewed and are negative.    Physical Exam Updated Vital Signs BP 115/69 (BP Location: Right Arm)   Temp 97.8 F (36.6 C) (Oral)  Resp 18   Ht 5\' 5"  (1.651 m)   Wt 81.2 kg   SpO2 98%   BMI 29.79 kg/m   Physical Exam  Constitutional: She is oriented to person, place, and time. She appears well-developed and well-nourished.  Sobbing during exam  HENT:  Head: Normocephalic and atraumatic.  Mouth/Throat: Oropharynx is clear and moist.  Eyes: Conjunctivae and EOM are normal. Pupils are equal, round, and reactive to light.  Neck: Normal range of motion.  Cardiovascular: Normal rate, regular rhythm and normal heart sounds.   Pulmonary/Chest: Effort normal and breath sounds normal.  Abdominal: Soft. Bowel sounds are normal.  Genitourinary:  Genitourinary Comments: Small 0.5cm  abscess of the right buttock just inferior to the gluteal cleft; patient begins screaming when this area is palpated, area is firm without noted fluctuance, no overlying erythema or induration  Musculoskeletal: Normal range of motion.  Neurological: She is alert and oriented to person, place, and time.  Skin: Skin is warm and dry.  Psychiatric: She has a normal mood and affect.  Nursing note and vitals reviewed.    ED Treatments / Results  Labs (all labs ordered are listed, but only abnormal results are displayed) Labs Reviewed - No data to display  EKG  EKG Interpretation None       Radiology No results found.  Procedures Procedures (including critical care time)  Medications Ordered in ED Medications - No data to display   Initial Impression / Assessment and Plan / ED Course  I have reviewed the triage vital signs and the nursing notes.  Pertinent labs & imaging results that were available during my care of the patient were reviewed by me and considered in my medical decision making (see chart for details).  31 year old female here with small abscess of the right buttock just inferior to the gluteal cleft. Patient is sobbing during exam after the nurse attempted to look at this. I was able to visualize the area which is small, about 0.5cm without central fluctuance. There is no surrounding erythema or induration. I had an open discussion with patient about options of I&D here with his antibiotic therapy as given her current presentation after just looking at the area, I do not feel she will tolerate this well.  She does not want I&D here.  Will start on course of clindamycin.  Encouraged warm soaks at home.  I have discussed with her if this is not improving in the next few days, she may ultimately need I&D.  Discussed plan with patient and friend at bedside, they both acknowledged understanding and agreed with plan of care.  Return precautions given for new or worsening  symptoms.  Final Clinical Impressions(s) / ED Diagnoses   Final diagnoses:  Abscess of buttock, right    New Prescriptions New Prescriptions   CLINDAMYCIN (CLEOCIN) 150 MG CAPSULE    Take 2 capsules (300 mg total) by mouth 3 (three) times daily. May dispense as 150mg  capsules   IBUPROFEN (ADVIL,MOTRIN) 800 MG TABLET    Take 1 tablet (800 mg total) by mouth 3 (three) times daily.     Garlon Hatchet, PA-C 01/29/17 1610    Lorre Nick, MD 02/01/17 [phone]

## 2017-01-29 NOTE — Discharge Instructions (Signed)
Take the prescribed medication as directed.  Soak the area in warm water 2-3x daily for the next few days.  This will help with healing. Follow-up with your primary care doctor.  If this is not improving in the next few days then you may need to have it I&D. Return to the ED for new or worsening symptoms.

## 2017-01-29 NOTE — ED Triage Notes (Signed)
Pain in coccyx 2 days ago, increased pain and knot noted 36 hours ago, area increasing in size

## 2017-01-30 ENCOUNTER — Emergency Department (HOSPITAL_COMMUNITY)
Admission: EM | Admit: 2017-01-30 | Discharge: 2017-01-31 | Disposition: A | Discharge status: Home / Self Care | Payer: Medicaid Other | Attending: Emergency Medicine | Admitting: Emergency Medicine

## 2017-01-30 ENCOUNTER — Encounter (HOSPITAL_COMMUNITY): Payer: Self-pay | Admitting: Emergency Medicine

## 2017-01-30 DIAGNOSIS — L0231 Cutaneous abscess of buttock: Secondary | ICD-10-CM | POA: Insufficient documentation

## 2017-01-30 DIAGNOSIS — L0291 Cutaneous abscess, unspecified: Secondary | ICD-10-CM

## 2017-01-30 MED ORDER — ONDANSETRON HCL 4 MG/2ML IJ SOLN
4.0000 mg | Freq: Once | INTRAMUSCULAR | Status: AC
  Administered 2017-01-30: 4 mg via INTRAVENOUS
  Filled 2017-01-30: qty 2

## 2017-01-30 MED ORDER — FENTANYL CITRATE (PF) 100 MCG/2ML IJ SOLN
100.0000 ug | Freq: Once | INTRAMUSCULAR | Status: AC
  Administered 2017-01-30: 100 ug via INTRAVENOUS
  Filled 2017-01-30: qty 2

## 2017-01-30 MED ORDER — SODIUM CHLORIDE 0.9 % IV SOLN
INTRAVENOUS | Status: DC
  Administered 2017-01-30: 23:00:00 via INTRAVENOUS

## 2017-01-30 MED ORDER — LIDOCAINE-EPINEPHRINE (PF) 2 %-1:200000 IJ SOLN
INTRAMUSCULAR | Status: AC
  Administered 2017-01-31: 10 mL
  Filled 2017-01-30: qty 20

## 2017-01-30 NOTE — ED Notes (Signed)
Patient was seen in ED on yesterday for right buttock abscess. States yesterday it was painful and red, today it looks more like a blister and the pain has worsened she can not even comfortably sit down.

## 2017-01-30 NOTE — ED Triage Notes (Signed)
Pt reports abscess to sacral area that has been causing pain for the last several days. Pt denies any drainage. Pt states that when she was here on 01/29/17 the dr advised that if pain worsened to return.

## 2017-01-31 NOTE — Discharge Instructions (Signed)
Continue to soak in the tub at least an hour 4 times a day until the pain and swelling have improved.  Return here as needed for problems.

## 2017-01-31 NOTE — ED Provider Notes (Signed)
[facility]-EMERGENCY DEPT Provider Note   CSN: [id number] Arrival date & time: 01/30/17  1946     History   Chief Complaint Chief Complaint  Patient presents with  . Abscess    HPI Kimberly Yoder is a 31 y.o. female.   She presents for evaluation of worsening pain and a known abscess, being treated with clindamycin.  He states the pain is worsening, despite the treatment which was initiated yesterday.  She denies fever chills nausea vomiting weakness or dizziness.  No prior similar problem.  There are no other known modifying factors.  HPI  History reviewed. No pertinent past medical history.  There are no active problems to display for this patient.   History reviewed. No pertinent surgical history.  OB History    No data available       Home Medications    Prior to Admission medications   Medication Sig Start Date End Date Taking? Authorizing Provider  clindamycin (CLEOCIN) 150 MG capsule Take 2 capsules (300 mg total) by mouth 3 (three) times daily. May dispense as 150mg  capsules 01/29/17  Yes Garlon Hatchet, PA-C  ibuprofen (ADVIL,MOTRIN) 800 MG tablet Take 1 tablet (800 mg total) by mouth 3 (three) times daily. 01/29/17  Yes Garlon Hatchet, PA-C  famotidine (PEPCID) 20 MG tablet Take 1 tablet (20 mg total) by mouth 2 (two) times daily. Patient not taking: Reported on 01/30/2017 05/13/14   Jaynie Crumble, PA-C  HYDROcodone-acetaminophen (NORCO) 5-325 MG tablet Take 1 tablet by mouth every 6 (six) hours as needed for severe pain. Patient not taking: Reported on 01/30/2017 07/21/16   Kellogg, PA-C  HYDROcodone-acetaminophen (NORCO/VICODIN) 5-325 MG per tablet Take 1 tablet by mouth every 4 (four) hours as needed for moderate pain or severe pain. Patient not taking: Reported on 01/30/2017 10/07/14   Everlene Farrier, PA-C  hydrOXYzine (ATARAX/VISTARIL) 25 MG tablet Take 1 tablet (25 mg total) by mouth every 4 (four) hours as needed. Patient not taking: Reported on 01/30/2017  05/13/14   Tatyana Kirichenko, PA-C  metroNIDAZOLE (FLAGYL) 500 MG tablet Take 1 tablet (500 mg total) by mouth 2 (two) times daily. Patient not taking: Reported on 01/30/2017 10/07/14   Everlene Farrier, PA-C  naproxen (NAPROSYN) 500 MG tablet Take 1 tablet (500 mg total) by mouth 2 (two) times daily as needed for mild pain, moderate pain or headache (TAKE WITH MEALS.). Patient not taking: Reported on 01/30/2017 07/21/16   Mercedes Street, PA-C  predniSONE (DELTASONE) 20 MG tablet Take 2 tablets (40 mg total) by mouth daily. Patient not taking: Reported on 01/30/2017 05/13/14   Jaynie Crumble, PA-C  promethazine (PHENERGAN) 25 MG tablet Take 1 tablet (25 mg total) by mouth every 6 (six) hours as needed for nausea or vomiting. Patient not taking: Reported on 01/30/2017 10/07/14   Everlene Farrier, PA-C    Family History Family History  Problem Relation Age of Onset  . CAD Other     Social History Social History  Substance Use Topics  . Smoking status: Light Tobacco Smoker  . Smokeless tobacco: Never Used  . Alcohol use Yes     Comment: occ     Allergies   Patient has no known allergies.   Review of Systems Review of Systems  All other systems reviewed and are negative.    Physical Exam Updated Vital Signs BP 110/73 (BP Location: Right Arm)   Pulse 82   Temp 98.8 F (37.1 C) (Oral)   Resp 19   Ht 5\' 5"  (  1.651 m)   Wt 179 lb (81.2 kg)   LMP 01/30/2017   SpO2 100%   BMI 29.79 kg/m   Physical Exam  Constitutional: She is oriented to person, place, and time. She appears well-developed and well-nourished.  HENT:  Head: Normocephalic and atraumatic.  Eyes: Conjunctivae and EOM are normal. Pupils are equal, round, and reactive to light.  Neck: Normal range of motion and phonation normal. Neck supple.  Cardiovascular: Normal rate.   Pulmonary/Chest: Effort normal. She exhibits no tenderness.  Genitourinary:  Genitourinary Comments: Right upper buttocks, with induration about 4  cm, with overlying central blister, containing purulent material.  Mild surrounding cellulitis extending to the left buttocks, and area about 10 cm in diameter.  Musculoskeletal: Normal range of motion.  Neurological: She is alert and oriented to person, place, and time. She exhibits normal muscle tone.  Skin: Skin is warm and dry.  Psychiatric: She has a normal mood and affect. Her behavior is normal. Judgment and thought content normal.  Nursing note and vitals reviewed.    ED Treatments / Results  Labs (all labs ordered are listed, but only abnormal results are displayed) Labs Reviewed - No data to display  EKG  EKG Interpretation None       Radiology No results found.  Procedures .Marland Kitchen.Incision and Drainage Date/Time: 01/31/2017 12:09 AM Performed by: Mancel BaleWENTZ, Ellie Spickler Authorized by: Mancel BaleWENTZ, Delina Kruczek   Consent:    Consent obtained:  Verbal   Consent given by:  Patient   Risks discussed:  Incomplete drainage and pain   Alternatives discussed:  No treatment Location:    Type:  Abscess   Size:  4 cm   Location: Right buttock. Pre-procedure details:    Skin preparation:  Betadine Anesthesia (see MAR for exact dosages):    Anesthesia method:  Local infiltration   Local anesthetic:  Lidocaine 1% w/o epi Procedure type:    Complexity:  Simple Procedure details:    Needle aspiration: no     Incision types:  Single straight   Incision depth:  Dermal   Scalpel blade:  11   Wound management:  Probed and deloculated   Drainage:  Purulent   Drainage amount:  Moderate   Wound treatment:  Wound left open   Packing materials:  None Post-procedure details:    Patient tolerance of procedure:  Tolerated well, no immediate complications   (including critical care time)  Medications Ordered in ED Medications  0.9 %  sodium chloride infusion ( Intravenous New Bag/Given 01/30/17 2305)  lidocaine-EPINEPHrine (XYLOCAINE W/EPI) 2 %-1:200000 (PF) injection (10 mLs  Given 01/31/17 0010)    fentaNYL (SUBLIMAZE) injection 100 mcg (100 mcg Intravenous Given 01/30/17 2309)  ondansetron (ZOFRAN) injection 4 mg (4 mg Intravenous Given 01/30/17 2314)     Initial Impression / Assessment and Plan / ED Course  I have reviewed the triage vital signs and the nursing notes.  Pertinent labs & imaging results that were available during my care of the patient were reviewed by me and considered in my medical decision making (see chart for details).     Medications  0.9 %  sodium chloride infusion ( Intravenous New Bag/Given 01/30/17 2305)  lidocaine-EPINEPHrine (XYLOCAINE W/EPI) 2 %-1:200000 (PF) injection (10 mLs  Given 01/31/17 0010)  fentaNYL (SUBLIMAZE) injection 100 mcg (100 mcg Intravenous Given 01/30/17 2309)  ondansetron (ZOFRAN) injection 4 mg (4 mg Intravenous Given 01/30/17 2314)    Patient Vitals for the past 24 hrs:  BP Temp Temp src Pulse Resp SpO2  Height Weight  01/30/17 2229 110/73 98.8 F (37.1 C) Oral 82 19 100 % - -  01/30/17 2001 - - - - - - 5\' 5"  (1.651 m) 179 lb (81.2 kg)  01/30/17 2000 122/81 99.1 F (37.3 C) Oral 105 18 100 % - -    12:29 AM Reevaluation with update and discussion. After initial assessment and treatment, an updated evaluation reveals she feels better at this time.  Findings discussed with the patient and all questions were answered. Lanae Federer L    Final Clinical Impressions(s) / ED Diagnoses   Final diagnoses:  Abscess    Abscess, right buttocks, requiring I&D for treatment.  Patient currently on clindamycin and he needs to continue because of cellulitis.  Doubt serious bacterial infection or metabolic instability  Nursing Notes Reviewed/ Care Coordinated Applicable Imaging Reviewed Interpretation of Laboratory Data incorporated into ED treatment  The patient appears reasonably screened and/or stabilized for discharge and I doubt any other medical condition or other [facility] requiring further screening, evaluation, or treatment in the ED at this  time prior to discharge.  Plan: Home Medications-continue current treatment; Home Treatments-aggressive soaks; return here if the recommended treatment, does not improve the symptoms; Recommended follow up-PCP, as needed \  New Prescriptions New Prescriptions   No medications on file     Mancel Bale, MD 01/31/17 [phone]

## 2017-03-02 ENCOUNTER — Inpatient Hospital Stay (HOSPITAL_COMMUNITY)
Admission: EM | Admit: 2017-03-02 | Discharge: 2017-03-05 | DRG: 373 | Disposition: A | Discharge status: Home / Self Care | Payer: Self-pay | Attending: Internal Medicine | Admitting: Internal Medicine

## 2017-03-02 ENCOUNTER — Encounter (HOSPITAL_COMMUNITY): Payer: Self-pay | Admitting: Emergency Medicine

## 2017-03-02 ENCOUNTER — Emergency Department (HOSPITAL_COMMUNITY): Payer: Self-pay

## 2017-03-02 DIAGNOSIS — R1030 Lower abdominal pain, unspecified: Secondary | ICD-10-CM

## 2017-03-02 DIAGNOSIS — Z87891 Personal history of nicotine dependence: Secondary | ICD-10-CM

## 2017-03-02 DIAGNOSIS — A0472 Enterocolitis due to Clostridium difficile, not specified as recurrent: Principal | ICD-10-CM | POA: Diagnosis present

## 2017-03-02 DIAGNOSIS — Z8249 Family history of ischemic heart disease and other diseases of the circulatory system: Secondary | ICD-10-CM

## 2017-03-02 DIAGNOSIS — R112 Nausea with vomiting, unspecified: Secondary | ICD-10-CM

## 2017-03-02 DIAGNOSIS — R197 Diarrhea, unspecified: Secondary | ICD-10-CM

## 2017-03-02 DIAGNOSIS — K529 Noninfective gastroenteritis and colitis, unspecified: Secondary | ICD-10-CM

## 2017-03-02 LAB — COMPREHENSIVE METABOLIC PANEL
ALT: 16 U/L (ref 14–54)
AST: 24 U/L (ref 15–41)
Albumin: 4.5 g/dL (ref 3.5–5.0)
Alkaline Phosphatase: 51 U/L (ref 38–126)
Anion gap: 11 (ref 5–15)
BUN: 6 mg/dL (ref 6–20)
CHLORIDE: 101 mmol/L (ref 101–111)
CO2: 25 mmol/L (ref 22–32)
Calcium: 9.4 mg/dL (ref 8.9–10.3)
Creatinine, Ser: 0.77 mg/dL (ref 0.44–1.00)
GFR calc non Af Amer: >60 mL/min (ref >60)
Glucose, Bld: 101 mg/dL — ABNORMAL HIGH (ref 65–99)
POTASSIUM: 3.6 mmol/L (ref 3.5–5.1)
Sodium: 137 mmol/L (ref 135–145)
Total Bilirubin: 1.1 mg/dL (ref 0.3–1.2)
Total Protein: 8.3 g/dL — ABNORMAL HIGH (ref 6.5–8.1)

## 2017-03-02 LAB — I-STAT BETA HCG BLOOD, ED (MC, WL, AP ONLY): I-stat hCG, quantitative: <5 m[IU]/mL (ref <5)

## 2017-03-02 LAB — URINALYSIS, ROUTINE W REFLEX MICROSCOPIC
BACTERIA UA: NONE SEEN
BILIRUBIN URINE: NEGATIVE
Glucose, UA: NEGATIVE mg/dL
Ketones, ur: NEGATIVE mg/dL
LEUKOCYTES UA: NEGATIVE
Nitrite: NEGATIVE
PH: 7 (ref 5.0–8.0)
Protein, ur: NEGATIVE mg/dL
Specific Gravity, Urine: 1.012 (ref 1.005–1.030)

## 2017-03-02 LAB — C DIFFICILE QUICK SCREEN W PCR REFLEX
C DIFFICILE (CDIFF) TOXIN: NEGATIVE
C Diff antigen: POSITIVE — AB

## 2017-03-02 LAB — CBC
HEMATOCRIT: 38.8 % (ref 36.0–46.0)
HEMOGLOBIN: 13.3 g/dL (ref 12.0–15.0)
MCH: 27.3 pg (ref 26.0–34.0)
MCHC: 34.3 g/dL (ref 30.0–36.0)
MCV: 79.7 fL (ref 78.0–100.0)
Platelets: 299 10*3/uL (ref 150–400)
RBC: 4.87 MIL/uL (ref 3.87–5.11)
RDW: 15.4 % (ref 11.5–15.5)
WBC: 9.9 10*3/uL (ref 4.0–10.5)

## 2017-03-02 LAB — LIPASE, BLOOD: LIPASE: 28 U/L (ref 11–51)

## 2017-03-02 MED ORDER — ONDANSETRON HCL 4 MG/2ML IJ SOLN
4.0000 mg | Freq: Four times a day (QID) | INTRAMUSCULAR | Status: DC | PRN
  Administered 2017-03-02: 4 mg via INTRAVENOUS
  Filled 2017-03-02: qty 2

## 2017-03-02 MED ORDER — OXYCODONE HCL 5 MG PO TABS
5.0000 mg | ORAL_TABLET | ORAL | Status: DC | PRN
  Administered 2017-03-03 – 2017-03-04 (×4): 5 mg via ORAL
  Filled 2017-03-02 (×5): qty 1

## 2017-03-02 MED ORDER — IOPAMIDOL (ISOVUE-300) INJECTION 61%
30.0000 mL | Freq: Once | INTRAVENOUS | Status: AC | PRN
  Administered 2017-03-02: 30 mL via ORAL

## 2017-03-02 MED ORDER — ONDANSETRON HCL 4 MG/2ML IJ SOLN
4.0000 mg | Freq: Four times a day (QID) | INTRAMUSCULAR | Status: DC
  Administered 2017-03-03 – 2017-03-04 (×7): 4 mg via INTRAVENOUS
  Filled 2017-03-02 (×8): qty 2

## 2017-03-02 MED ORDER — ONDANSETRON HCL 4 MG PO TABS
4.0000 mg | ORAL_TABLET | Freq: Four times a day (QID) | ORAL | Status: DC
  Filled 2017-03-02 (×2): qty 1

## 2017-03-02 MED ORDER — SODIUM CHLORIDE 0.9 % IV BOLUS (SEPSIS)
1000.0000 mL | Freq: Once | INTRAVENOUS | Status: AC
  Administered 2017-03-02: 1000 mL via INTRAVENOUS

## 2017-03-02 MED ORDER — SODIUM CHLORIDE 0.9 % IV SOLN
INTRAVENOUS | Status: DC
  Administered 2017-03-02 – 2017-03-05 (×5): via INTRAVENOUS

## 2017-03-02 MED ORDER — MORPHINE SULFATE (PF) 2 MG/ML IV SOLN
6.0000 mg | Freq: Once | INTRAVENOUS | Status: AC
  Administered 2017-03-02: 6 mg via INTRAVENOUS
  Filled 2017-03-02: qty 3

## 2017-03-02 MED ORDER — IOPAMIDOL (ISOVUE-300) INJECTION 61%
INTRAVENOUS | Status: AC
  Filled 2017-03-02: qty 30

## 2017-03-02 MED ORDER — ACETAMINOPHEN 325 MG PO TABS
650.0000 mg | ORAL_TABLET | Freq: Four times a day (QID) | ORAL | Status: DC | PRN
  Administered 2017-03-04 – 2017-03-05 (×4): 650 mg via ORAL
  Filled 2017-03-02 (×4): qty 2

## 2017-03-02 MED ORDER — ONDANSETRON HCL 4 MG/2ML IJ SOLN
4.0000 mg | Freq: Once | INTRAMUSCULAR | Status: AC
  Administered 2017-03-02: 4 mg via INTRAVENOUS
  Filled 2017-03-02: qty 2

## 2017-03-02 MED ORDER — IOPAMIDOL (ISOVUE-300) INJECTION 61%
INTRAVENOUS | Status: AC
  Filled 2017-03-02: qty 100

## 2017-03-02 MED ORDER — ENOXAPARIN SODIUM 40 MG/0.4ML ~~LOC~~ SOLN
40.0000 mg | SUBCUTANEOUS | Status: DC
  Administered 2017-03-02 – 2017-03-04 (×3): 40 mg via SUBCUTANEOUS
  Filled 2017-03-02 (×3): qty 0.4

## 2017-03-02 MED ORDER — ACETAMINOPHEN 650 MG RE SUPP
650.0000 mg | Freq: Four times a day (QID) | RECTAL | Status: DC | PRN
Start: 2017-03-02 — End: 2017-03-05

## 2017-03-02 MED ORDER — IOPAMIDOL (ISOVUE-300) INJECTION 61%
100.0000 mL | Freq: Once | INTRAVENOUS | Status: AC | PRN
  Administered 2017-03-02: 100 mL via INTRAVENOUS

## 2017-03-02 MED ORDER — MORPHINE SULFATE (PF) 2 MG/ML IV SOLN
2.0000 mg | INTRAVENOUS | Status: DC | PRN
  Administered 2017-03-02: 2 mg via INTRAVENOUS
  Filled 2017-03-02: qty 1

## 2017-03-02 NOTE — H&P (Signed)
History and Physical    Kimberly Yoder AVW:[id number] DOB: 1986/08/10 DOA: 03/02/2017    PCP: No PCP Per Patient  Patient coming from: home  Chief Complaint: vomiting diarrhea and abdominal pain  HPI: Kimberly Yoder is a 31 y.o. female with a recent right buttock abscess s/p I and D in the ER last month. Given a course of Clinda (3/5 - 3/12) with improvement. She tells me she has had diarrhea "every 15 min" for about 2 wks now and yesterday began vomiting. Non bloody emesis and stool. She has had lower abdominal pain as well. She was drinking large amounts of gator aid to stay hydrated. Solid food causes worsened clamping and diarrhea. Has felt hot and cold chills. Temp max has been 99.   ED Course: temp 100.2, BP 106/67  Review of Systems:  All other systems reviewed and apart from HPI, are negative.  History reviewed. No pertinent past medical history.  History reviewed. No pertinent surgical history.  Social History:   reports that she has quit smoking. She has never used smokeless tobacco. She reports that she drinks alcohol. She reports that she does not use drugs.  No Known Allergies  Family History  Problem Relation Age of Onset  . CAD Other      Prior to Admission medications - Not taking any medications currently.    Physical Exam: Vitals:   03/02/17 0858 03/02/17 0859 03/02/17 1215 03/02/17 1517  BP: 127/90  112/66 106/67  Pulse: (!) 102  86 88  Resp: Temp: 99.4 F (37.4 C)  99.5 F (37.5 C) 100.2 F (37.9 C)  TempSrc: Oral  Oral Oral  SpO2: 100%  100% 99%  Weight:  81.2 kg (179 lb)    Height:   (1.651 m)        Constitutional:   calm, comfortable- starts to cry when I talk to her and tells me she is hungry and want to eat right away Eyes: PERTLA, lids and conjunctivae normal ENMT: Mucous membranes are moist. Posterior pharynx clear of any exudate or lesions. Normal dentition.  Neck: normal, supple, no masses, no  thyromegaly Respiratory: clear to auscultation bilaterally, no wheezing, no crackles. Normal respiratory effort. No accessory muscle use.  Cardiovascular: S1 & S2 heard, regular rate and rhythm, no murmurs / rubs / gallops. No extremity edema. 2+ pedal pulses. No carotid bruits.  Abdomen: No distension,  Tenderness across lower abdomen, no masses palpated. No hepatosplenomegaly. Bowel sounds normal.  Musculoskeletal: no clubbing / cyanosis. No joint deformity upper and lower extremities. Good ROM, no contractures. Normal muscle tone.  Skin: no rashes, lesions, ulcers. No induration Neurologic: CN 2-12 grossly intact. Sensation intact, DTR normal. Strength 5/5 in all 4 limbs.  Psychiatric: Normal judgment and insight. Alert and oriented x 3. Depressed mood.     Labs on Admission: I have personally reviewed following labs and imaging studies  CBC:  Recent Labs Lab 03/02/17 0944  WBC 9.9  HGB 13.3  HCT 38.8  MCV 79.7  PLT 299   Basic Metabolic Panel:  Recent Labs Lab 03/02/17 0944  NA 137  K 3.6  CL 101  CO2 25  GLUCOSE 101*  BUN 6  CREATININE 0.77  CALCIUM 9.4   GFR: Estimated Creatinine Clearance: 107.3 mL/min (by C-G formula based on SCr of 0.77 mg/dL). Liver Function Tests:  Recent Labs Lab 03/02/17 0944  AST 24  ALT 16  ALKPHOS 51  BILITOT 1.1  PROT  8.3*  ALBUMIN 4.5    Recent Labs Lab 03/02/17 0944  LIPASE 28   No results for input(s): AMMONIA in the last 168 hours. Coagulation Profile: No results for input(s): INR, PROTIME in the last 168 hours. Cardiac Enzymes: No results for input(s): CKTOTAL, CKMB, CKMBINDEX, TROPONINI in the last 168 hours. BNP (last 3 results) No results for input(s): PROBNP in the last 8760 hours. HbA1C: No results for input(s): HGBA1C in the last 72 hours. CBG: No results for input(s): GLUCAP in the last 168 hours. Lipid Profile: No results for input(s): CHOL, HDL, LDLCALC, TRIG, CHOLHDL, LDLDIRECT in the last 72  hours. Thyroid Function Tests: No results for input(s): TSH, T4TOTAL, FREET4, T3FREE, THYROIDAB in the last 72 hours. Anemia Panel: No results for input(s): VITAMINB12, FOLATE, FERRITIN, TIBC, IRON, RETICCTPCT in the last 72 hours. Urine analysis:    Component Value Date/Time   COLORURINE YELLOW 03/02/2017 0947   APPEARANCEUR CLEAR 03/02/2017 0947   LABSPEC 1.012 03/02/2017 0947   PHURINE 7.0 03/02/2017 0947   GLUCOSEU NEGATIVE 03/02/2017 0947   HGBUR SMALL (A) 03/02/2017 0947   BILIRUBINUR NEGATIVE 03/02/2017 0947   KETONESUR NEGATIVE 03/02/2017 0947   PROTEINUR NEGATIVE 03/02/2017 0947   UROBILINOGEN 1.0 10/07/2014 1718   NITRITE NEGATIVE 03/02/2017 0947   LEUKOCYTESUR NEGATIVE 03/02/2017 0947   Sepsis Labs: (procalcitonin:4,lacticidven:4) )No results found for this or any previous visit (from the past 240 hour(s)).   Radiological Exams on Admission: Ct Abdomen Pelvis W Contrast  Result Date: 03/02/2017 CLINICAL DATA:  Generalized abdominal pain EXAM: CT ABDOMEN AND PELVIS WITH CONTRAST TECHNIQUE: Multidetector CT imaging of the abdomen and pelvis was performed using the standard protocol following bolus administration of intravenous contrast. Oral contrast was also administered. CONTRAST:  ISOVUE-300 IOPAMIDOL (ISOVUE-300) INJECTION 61% COMPARISON:  October 07, 2014 FINDINGS: Lower chest: Lung bases are clear. Hepatobiliary: No focal liver lesions are evident. Gallbladder wall is not appreciably thickened. There is no appreciable biliary duct dilatation. Pancreas: There is no pancreatic mass or inflammatory focus. Spleen: No splenic lesions are evident. Adrenals/Urinary Tract: Adrenals appear unremarkable bilaterally. Kidneys bilaterally show no mass or hydronephrosis on either side. No renal or ureteral calculus is appreciable on either side. Urinary bladder is midline with wall thickness within normal limits. Stomach/Bowel: There is localized wall thickening in the  cecum. There is no fistula or abscess in this area. There is no other appreciable bowel wall thickening. There is no evident bowel obstruction. No free air or portal venous air. Vascular/Lymphatic: There is no abdominal aortic aneurysm. No vascular lesions are evident. There are a few subcentimeter lymph nodes in the right lower quadrant region. By size criteria, there is no adenopathy abdomen or pelvis. Reproductive: The uterus is retroverted. There is no pelvic mass. A small amount of free fluid is noted in the cul-de-sac region. Other: Appendix is not seen. There is no appreciable periappendiceal inflammatory type change. There is no abscess in the abdomen or pelvis. There is a minimal ventral hernia containing only fat. Musculoskeletal: There are no blastic or lytic bone lesions. There is no intramuscular or abdominal wall lesion. IMPRESSION: There is localized wall thickening in the cecum, likely of inflammatory etiology. No fistula seen. No bowel obstruction. No other bowel wall thickening evident. Note that the appendix is not appreciable on this study. There is not appear to be periappendiceal inflammation. There are a few subcentimeter lymph nodes in the right lower quadrant which may well have inflammatory etiology given the thickening of the wall  of the cecum. Small amount of free fluid in the cul-de-sac. A small amount of fluid may be upper physiologic or could indicate recent ovarian cyst rupture. It also could result secondary to the inflammation of the cecum. No demonstrable abscess. No renal or ureteral calculus. No hydronephrosis. Electronically Signed   By: Bretta Bang III M.D.   On: 03/02/2017 12:07       Assessment/Plan Principal Problem:   Colitis  - Inflamed Cecum on CT and mildly enlarged lymph nodes - tender in right and left lower abdomen - clear liquids, IVF - stool studies- high risk for C diff due to recent Clinda use - pain and nausea control - gen sugery called by ER  as appendix not visualized on CT- they will follow     DVT prophylaxis: Lovenox Code Status: Full code  Family Communication: mother at bedside  Disposition Plan: home tomorrow if improved  Consults called: Gen surgery called by ER  Admission status: observation    [facility] MD [facility] Pager: [email or web address] Password [email or web address] 7PM-7AM, please contact night-coverage   03/02/2017, 5:58 PM

## 2017-03-02 NOTE — ED Provider Notes (Signed)
[facility]-EMERGENCY DEPT Provider Note   CSN: [id number] Arrival date & time: 03/02/17  4098     History   Chief Complaint Chief Complaint  Patient presents with  . Abdominal Pain  . Emesis  . Diarrhea    HPI Kimberly Yoder is a 31 y.o. female.  HPI 31 year old female who presents with generalized abdominal pain. She is otherwise healthy without any abdominal surgeries. States that she did have a week of diarrhea about a week ago that resolved. Last night had acute onset of nonbloody nonbilious emesis and constant profuse nonbloody diarrhea. It is associated with constant gradually worsening generalized abdominal pain that is constant, worse with palpation and movement. No alleviating factors. Did take loperamide for symptoms, with no improvement in her diarrhea. He has not had fevers but having chills. No dysuria, hematuria but having urinary frequency. No known sick contacts, suspicious food exposures, recent antibiotics, travel.   History reviewed. No pertinent past medical history.  There are no active problems to display for this patient.   History reviewed. No pertinent surgical history.  OB History    No data available       Home Medications    Prior to Admission medications   Medication Sig Start Date End Date Taking? Authorizing Provider  acetaminophen (TYLENOL) 500 MG tablet Take 500 mg by mouth every 6 (six) hours as needed for moderate pain or headache.   Yes Historical Provider, MD  ibuprofen (ADVIL,MOTRIN) 800 MG tablet Take 1 tablet (800 mg total) by mouth 3 (three) times daily. Patient taking differently: Take 800 mg by mouth every 8 (eight) hours as needed for moderate pain.  01/29/17  Yes Garlon Hatchet, PA-C  clindamycin (CLEOCIN) 150 MG capsule Take 2 capsules (300 mg total) by mouth 3 (three) times daily. May dispense as  capsules Patient not taking: Reported on 03/02/2017 01/29/17   Garlon Hatchet, PA-C  famotidine (PEPCID) 20 MG tablet Take 1 tablet (20  mg total) by mouth 2 (two) times daily. Patient not taking: Reported on 01/30/2017 05/13/14   Jaynie Crumble, PA-C  HYDROcodone-acetaminophen (NORCO) 5-325 MG tablet Take 1 tablet by mouth every 6 (six) hours as needed for severe pain. Patient not taking: Reported on 01/30/2017 07/21/16   Kellogg, PA-C  HYDROcodone-acetaminophen (NORCO/VICODIN) 5-325 MG per tablet Take 1 tablet by mouth every 4 (four) hours as needed for moderate pain or severe pain. Patient not taking: Reported on 01/30/2017 10/07/14   Everlene Farrier, PA-C  hydrOXYzine (ATARAX/VISTARIL) 25 MG tablet Take 1 tablet (25 mg total) by mouth every 4 (four) hours as needed. Patient not taking: Reported on 01/30/2017 05/13/14   Tatyana Kirichenko, PA-C  metroNIDAZOLE (FLAGYL) 500 MG tablet Take 1 tablet (500 mg total) by mouth 2 (two) times daily. Patient not taking: Reported on 01/30/2017 10/07/14   Everlene Farrier, PA-C  naproxen (NAPROSYN) 500 MG tablet Take 1 tablet (500 mg total) by mouth 2 (two) times daily as needed for mild pain, moderate pain or headache (TAKE WITH MEALS.). Patient not taking: Reported on 01/30/2017 07/21/16   Mercedes Street, PA-C  predniSONE (DELTASONE) 20 MG tablet Take 2 tablets (40 mg total) by mouth daily. Patient not taking: Reported on 01/30/2017 05/13/14   Jaynie Crumble, PA-C  promethazine (PHENERGAN) 25 MG tablet Take 1 tablet (25 mg total) by mouth every 6 (six) hours as needed for nausea or vomiting. Patient not taking: Reported on 01/30/2017 10/07/14   Everlene Farrier, PA-C    Family History Family History  Problem Relation Age of Onset  . CAD Other     Social History Social History  Substance Use Topics  . Smoking status: Former Games developer  . Smokeless tobacco: Never Used  . Alcohol use Yes     Comment: occ     Allergies   Patient has no known allergies.   Review of Systems Review of Systems  Constitutional: Positive for chills. Negative for fever.  HENT: Negative for congestion.     Respiratory: Negative for cough and shortness of breath.   Cardiovascular: Negative for chest pain.  Gastrointestinal: Positive for abdominal pain, diarrhea and vomiting.  Genitourinary: Negative for flank pain.  Musculoskeletal: Negative for back pain.  Allergic/Immunologic: Negative for immunocompromised state.  Neurological: Negative for syncope.  Hematological: Does not bruise/bleed easily.  Psychiatric/Behavioral: Negative for confusion.      Physical Exam Updated Vital Signs BP 106/67 (BP Location: Left Arm)   Pulse 88   Temp 100.2 F (37.9 C) (Oral)   Resp 16   Ht  (1.651 m)   Wt 179 lb (81.2 kg)   LMP 02/25/2017   SpO2 99%   BMI 29.79 kg/m   Physical Exam Physical Exam  Nursing note and vitals reviewed. Constitutional: Appears uncomfortable and tearful, non-toxic, and in no acute distress Head: Normocephalic and atraumatic.  Mouth/Throat: Oropharynx is clear and moist.  Neck: Normal range of motion. Neck supple.  Cardiovascular: Normal rate and regular rhythm.   Pulmonary/Chest: Effort normal and breath sounds normal.  Abdominal: Soft. There is diffuse abdominal tenderness. There is no rebound and no guarding.  Musculoskeletal: Normal range of motion.  Neurological: Alert, no facial droop, fluent speech, moves all extremities symmetrically Skin: Skin is warm and dry.  Psychiatric: Cooperative   ED Treatments / Results  Labs (all labs ordered are listed, but only abnormal results are displayed) Labs Reviewed  COMPREHENSIVE METABOLIC PANEL - Abnormal; Notable for the following:       Result Value   Glucose, Bld 101 (*)    Total Protein 8.3 (*)    All other components within normal limits  URINALYSIS, ROUTINE W REFLEX MICROSCOPIC - Abnormal; Notable for the following:    Hgb urine dipstick SMALL (*)    Squamous Epithelial / LPF 0-5 (*)    All other components within normal limits  GASTROINTESTINAL PANEL BY PCR, STOOL (REPLACES STOOL CULTURE)  C  DIFFICILE QUICK SCREEN W PCR REFLEX  LIPASE, BLOOD  CBC  I-STAT BETA HCG BLOOD, ED ([facility], [facility], [facility] ONLY)    EKG  EKG Interpretation None       Radiology Ct Abdomen Pelvis W Contrast  Result Date: 03/02/2017 CLINICAL DATA:  Generalized abdominal pain EXAM: CT ABDOMEN AND PELVIS WITH CONTRAST TECHNIQUE: Multidetector CT imaging of the abdomen and pelvis was performed using the standard protocol following bolus administration of intravenous contrast. Oral contrast was also administered. CONTRAST:  ISOVUE-300 IOPAMIDOL (ISOVUE-300) INJECTION 61% COMPARISON:  October 07, 2014 FINDINGS: Lower chest: Lung bases are clear. Hepatobiliary: No focal liver lesions are evident. Gallbladder wall is not appreciably thickened. There is no appreciable biliary duct dilatation. Pancreas: There is no pancreatic mass or inflammatory focus. Spleen: No splenic lesions are evident. Adrenals/Urinary Tract: Adrenals appear unremarkable bilaterally. Kidneys bilaterally show no mass or hydronephrosis on either side. No renal or ureteral calculus is appreciable on either side. Urinary bladder is midline with wall thickness within normal limits. Stomach/Bowel: There is localized wall thickening in the cecum. There is no fistula or abscess in  this area. There is no other appreciable bowel wall thickening. There is no evident bowel obstruction. No free air or portal venous air. Vascular/Lymphatic: There is no abdominal aortic aneurysm. No vascular lesions are evident. There are a few subcentimeter lymph nodes in the right lower quadrant region. By size criteria, there is no adenopathy abdomen or pelvis. Reproductive: The uterus is retroverted. There is no pelvic mass. A small amount of free fluid is noted in the cul-de-sac region. Other: Appendix is not seen. There is no appreciable periappendiceal inflammatory type change. There is no abscess in the abdomen or pelvis. There is a minimal ventral hernia containing only fat.  Musculoskeletal: There are no blastic or lytic bone lesions. There is no intramuscular or abdominal wall lesion. IMPRESSION: There is localized wall thickening in the cecum, likely of inflammatory etiology. No fistula seen. No bowel obstruction. No other bowel wall thickening evident. Note that the appendix is not appreciable on this study. There is not appear to be periappendiceal inflammation. There are a few subcentimeter lymph nodes in the right lower quadrant which may well have inflammatory etiology given the thickening of the wall of the cecum. Small amount of free fluid in the cul-de-sac. A small amount of fluid may be upper physiologic or could indicate recent ovarian cyst rupture. It also could result secondary to the inflammation of the cecum. No demonstrable abscess. No renal or ureteral calculus. No hydronephrosis. Electronically Signed   By: Bretta Bang III M.D.   On: 03/02/2017 12:07    Procedures Procedures (including critical care time)  Medications Ordered in ED Medications  iopamidol (ISOVUE-300) 61 % injection (not administered)  iopamidol (ISOVUE-300) 61 % injection (not administered)  sodium chloride 0.9 % bolus 1,000 mL (0 mLs Intravenous Stopped 03/02/17 1430)  morphine 2 MG/ML injection 6 mg (6 mg Intravenous Given 03/02/17 0952)  ondansetron (ZOFRAN) injection 4 mg (4 mg Intravenous Given 03/02/17 0951)  iopamidol (ISOVUE-300) 61 % injection 30 mL (30 mLs Oral Contrast Given 03/02/17 0917)  iopamidol (ISOVUE-300) 61 % injection 100 mL (100 mLs Intravenous Contrast Given 03/02/17 1149)     Initial Impression / Assessment and Plan / ED Course  I have reviewed the triage vital signs and the nursing notes.  Pertinent labs & imaging results that were available during my care of the patient were reviewed by me and considered in my medical decision making (see chart for details).    Nontoxic in no acute distress, with soft and non-peritoneal abdomen. Tenderness diffusely, but  worse in the low abdomen bilaterally. Differential includes early appendicitis vs gastroenteritis vs colitis. UA without infection. No pelvic symptoms. Blood work without leukocytosis or major electrolyte/metabolic derangements. She eventually does have low-grade fever 100.2 Fahrenheit, but is hemodynamically stable.  CT visualized, and shows evidence of inflammation around the cecum. The appendix could not be visualized but there is no appendiceal inflammation. On reevaluation, vomiting improved, but still having some persistent pain in the low abdomen bilaterally including the right lower quadrant. Given nonvisualization of the appendix with persistent pain, general surgery was consulted. Unclear if this could be early appendicitis at this time, but currently medical management recommended. They recommended admission for serial abdominal exams, and GI panel/C. difficile sample to be performed. Discussed with Dr. Butler Denmark who will admit.  Final Clinical Impressions(s) / ED Diagnoses   Final diagnoses:  Nausea vomiting and diarrhea  Lower abdominal pain    New Prescriptions New Prescriptions   No medications on file  Lavera Guise, MD 03/02/17 [phone]

## 2017-03-02 NOTE — Consult Note (Signed)
Reason for Consult: Possible appendicitis vs gastroenteritis Referring Physician:Dr. Tyrone Schimke PCP:  No PCP Per Patient  CC: diarrhea 3/25- 3/31, improved and started again 02/28/17. Last PM onset of lower abdominal pain, with emesis. Pain medication here makes pain better.  Nothing at home made it better. Worse with ambulation, worse with any PO intake. Solids or liquids.    Kimberly Yoder is an 31 y.o. female.  HPI: Pt presents with several days of diarrhea that was improving.  Last night she developed acute onset of emesis, diarrhea and gradually worsening abdominal pain.  It is constant, and worse with any movement, and palpation. She completed a course of antibiotics for rectal abscess 3/5 - 02/07/17.  She also traveled to [facility] after this treatment.  Pain is in the lower abdomen, below the umbilicus.  Left and right sided pain is equal.   Work up in the ED shows she is afebrile, TM 99.5, VSS.  WBC is 9.9, H/H is normal.  CMP is normal.   CT scan shows:  There is no abscess in the abdomen or pelvis. There is a minimal ventral hernia containing only fat. There is localized wall thickening in the cecum. There is no fistula or abscess in this area. There is no other appreciable bowel wall thickening. There is no evident bowel obstruction. No free air or portal venous air. Appendix is not seen. There is no appreciable periappendiceal inflammatory type change.  She did have a perirectal abscess and was treated with antibiotics on 01/31/17 and was treated with antibiotics.  We are ask to see.     History reviewed. No pertinent past medical history.  None  History reviewed. No pertinent surgical history.  None  Family History  Problem Relation Age of Onset  . CAD Other   Father deceased with Prostate CA Mother OK living 3 brothersOK 2 sisters one with some kind of polyps, and spleen issue.   Social History:  reports that she has quit smoking. She has never used smokeless tobacco. She reports  that she drinks alcohol. She reports that she does not use drugs. Tobacco:  None ETOH:  2 beers per day normal , more with events Drugs:  None Single Works at [employer] No children.    Allergies: No Known Allergies  Prior to Admission medications   She is taking none of the Medicines listed below.   Medication Sig Start Date End Date Taking? Authorizing Provider  clindamycin (CLEOCIN) 150 MG capsule Take 2 capsules (300 mg total) by mouth 3 (three) times daily. May dispense as 1107m capsules 01/29/17   LLarene Pickett PA-C  famotidine (PEPCID) 20 MG tablet Take 1 tablet (20 mg total) by mouth 2 (two) times daily. Patient not taking: Reported on 01/30/2017 05/13/14   TJeannett Senior PA-C  HYDROcodone-acetaminophen (NORCO) 5-325 MG tablet Take 1 tablet by mouth every 6 (six) hours as needed for severe pain. Patient not taking: Reported on 01/30/2017 07/21/16   MLockheed Martin PA-C  HYDROcodone-acetaminophen (NORCO/VICODIN) 5-325 MG per tablet Take 1 tablet by mouth every 4 (four) hours as needed for moderate pain or severe pain. Patient not taking: Reported on 01/30/2017 10/07/14   WWaynetta Pean PA-C  hydrOXYzine (ATARAX/VISTARIL) 25 MG tablet Take 1 tablet (25 mg total) by mouth every 4 (four) hours as needed. Patient not taking: Reported on 01/30/2017 05/13/14   Tatyana Kirichenko, PA-C  ibuprofen (ADVIL,MOTRIN) 800 MG tablet Take 1 tablet (800 mg total) by mouth 3 (three) times daily. 01/29/17  Larene Pickett, PA-C  metroNIDAZOLE (FLAGYL) 500 MG tablet Take 1 tablet (500 mg total) by mouth 2 (two) times daily. Patient not taking: Reported on 01/30/2017 10/07/14   Waynetta Pean, PA-C  naproxen (NAPROSYN) 500 MG tablet Take 1 tablet (500 mg total) by mouth 2 (two) times daily as needed for mild pain, moderate pain or headache (TAKE WITH MEALS.). Patient not taking: Reported on 01/30/2017 07/21/16   Mercedes Street, PA-C  predniSONE (DELTASONE) 20 MG tablet Take 2 tablets (40 mg total) by mouth  daily. Patient not taking: Reported on 01/30/2017 05/13/14   Jeannett Senior, PA-C  promethazine (PHENERGAN) 25 MG tablet Take 1 tablet (25 mg total) by mouth every 6 (six) hours as needed for nausea or vomiting. Patient not taking: Reported on 01/30/2017 10/07/14   Waynetta Pean, PA-C     Results for orders placed or performed during the hospital encounter of 03/02/17 (from the past 48 hour(s))  Lipase, blood     Status: None   Collection Time: 03/02/17  9:44 AM  Result Value Ref Range   Lipase 28 11 - 51 U/L  Comprehensive metabolic panel     Status: Abnormal   Collection Time: 03/02/17  9:44 AM  Result Value Ref Range   Sodium 137 135 - 145 mmol/L   Potassium 3.6 3.5 - 5.1 mmol/L   Chloride 101 101 - 111 mmol/L   CO2 25 22 - 32 mmol/L   Glucose, Bld 101 (H) 65 - 99 mg/dL   BUN 6 6 - 20 mg/dL   Creatinine, Ser 0.77 0.44 - 1.00 mg/dL   Calcium 9.4 8.9 - 10.3 mg/dL   Total Protein 8.3 (H) 6.5 - 8.1 g/dL   Albumin 4.5 3.5 - 5.0 g/dL   AST 24 15 - 41 U/L   ALT 16 14 - 54 U/L   Alkaline Phosphatase 51 38 - 126 U/L   Total Bilirubin 1.1 0.3 - 1.2 mg/dL   GFR calc non Af Amer >60 >60 mL/min   GFR calc Af Amer >60 >60 mL/min    Comment: (NOTE) The eGFR has been calculated using the CKD EPI equation. This calculation has not been validated in all clinical situations. eGFR's persistently <60 mL/min signify possible Chronic Kidney Disease.    Anion gap 11 5 - 15  CBC     Status: None   Collection Time: 03/02/17  9:44 AM  Result Value Ref Range   WBC 9.9 4.0 - 10.5 K/uL   RBC 4.87 3.87 - 5.11 MIL/uL   Hemoglobin 13.3 12.0 - 15.0 g/dL   HCT 38.8 36.0 - 46.0 %   MCV 79.7 78.0 - 100.0 fL   MCH 27.3 26.0 - 34.0 pg   MCHC 34.3 30.0 - 36.0 g/dL   RDW 15.4 11.5 - 15.5 %   Platelets 299 150 - 400 K/uL  Urinalysis, Routine w reflex microscopic     Status: Abnormal   Collection Time: 03/02/17  9:47 AM  Result Value Ref Range   Color, Urine YELLOW YELLOW   APPearance CLEAR CLEAR    Specific Gravity, Urine 1.012 1.005 - 1.030   pH 7.0 5.0 - 8.0   Glucose, UA NEGATIVE NEGATIVE mg/dL   Hgb urine dipstick SMALL (A) NEGATIVE   Bilirubin Urine NEGATIVE NEGATIVE   Ketones, ur NEGATIVE NEGATIVE mg/dL   Protein, ur NEGATIVE NEGATIVE mg/dL   Nitrite NEGATIVE NEGATIVE   Leukocytes, UA NEGATIVE NEGATIVE   RBC / HPF 0-5 0 - 5 RBC/hpf   WBC,  UA 0-5 0 - 5 WBC/hpf   Bacteria, UA NONE SEEN NONE SEEN   Squamous Epithelial / LPF 0-5 (A) NONE SEEN  I-Stat beta hCG blood, ED     Status: None   Collection Time: 03/02/17  9:52 AM  Result Value Ref Range   I-stat hCG, quantitative <5.0 <5 mIU/mL   Comment 3            Comment:   GEST. AGE      CONC.  (mIU/mL)   <=1 WEEK        5 - 50     2 WEEKS       50 - 500     3 WEEKS       100 - 10,000     4 WEEKS     1,000 - 30,000        FEMALE AND NON-PREGNANT FEMALE:     LESS THAN 5 mIU/mL     Ct Abdomen Pelvis W Contrast  Result Date: 03/02/2017 CLINICAL DATA:  Generalized abdominal pain EXAM: CT ABDOMEN AND PELVIS WITH CONTRAST TECHNIQUE: Multidetector CT imaging of the abdomen and pelvis was performed using the standard protocol following bolus administration of intravenous contrast. Oral contrast was also administered. CONTRAST:  175m ISOVUE-300 IOPAMIDOL (ISOVUE-300) INJECTION 61% COMPARISON:  October 07, 2014 FINDINGS: Lower chest: Lung bases are clear. Hepatobiliary: No focal liver lesions are evident. Gallbladder wall is not appreciably thickened. There is no appreciable biliary duct dilatation. Pancreas: There is no pancreatic mass or inflammatory focus. Spleen: No splenic lesions are evident. Adrenals/Urinary Tract: Adrenals appear unremarkable bilaterally. Kidneys bilaterally show no mass or hydronephrosis on either side. No renal or ureteral calculus is appreciable on either side. Urinary bladder is midline with wall thickness within normal limits. Stomach/Bowel: There is localized wall thickening in the cecum. There is no fistula or  abscess in this area. There is no other appreciable bowel wall thickening. There is no evident bowel obstruction. No free air or portal venous air. Vascular/Lymphatic: There is no abdominal aortic aneurysm. No vascular lesions are evident. There are a few subcentimeter lymph nodes in the right lower quadrant region. By size criteria, there is no adenopathy abdomen or pelvis. Reproductive: The uterus is retroverted. There is no pelvic mass. A small amount of free fluid is noted in the cul-de-sac region. Other: Appendix is not seen. There is no appreciable periappendiceal inflammatory type change. There is no abscess in the abdomen or pelvis. There is a minimal ventral hernia containing only fat. Musculoskeletal: There are no blastic or lytic bone lesions. There is no intramuscular or abdominal wall lesion. IMPRESSION: There is localized wall thickening in the cecum, likely of inflammatory etiology. No fistula seen. No bowel obstruction. No other bowel wall thickening evident. Note that the appendix is not appreciable on this study. There is not appear to be periappendiceal inflammation. There are a few subcentimeter lymph nodes in the right lower quadrant which may well have inflammatory etiology given the thickening of the wall of the cecum. Small amount of free fluid in the cul-de-sac. A small amount of fluid may be upper physiologic or could indicate recent ovarian cyst rupture. It also could result secondary to the inflammation of the cecum. No demonstrable abscess. No renal or ureteral calculus. No hydronephrosis. Electronically Signed   By: WLowella GripIII M.D.   On: 03/02/2017 12:07    Review of Systems  Constitutional: Positive for chills. Negative for diaphoresis, fever, malaise/fatigue and weight loss.  HENT:  Negative.   Eyes: Negative.   Respiratory: Negative.   Cardiovascular: Negative.   Gastrointestinal: Positive for abdominal pain, blood in stool (with diarrhea a few days ago, not  occuring now), diarrhea, nausea and vomiting. Negative for constipation and heartburn.  Genitourinary: Negative.   Musculoskeletal: Negative.   Skin: Negative.   Neurological: Negative.  Negative for weakness.  Endo/Heme/Allergies: Negative.   Psychiatric/Behavioral: Negative.    Blood pressure 112/66, pulse 86, temperature 99.5 F (37.5 C), temperature source Oral, resp. rate 14, height _0  (1.651 m), weight 81.2 kg (179 lb), last menstrual period 02/25/2017, SpO2 100 %. Physical Exam  Constitutional: She is oriented to person, place, and time. She appears well-developed and well-nourished. No distress.  HENT:  Head: Normocephalic and atraumatic.  Eyes: Conjunctivae and EOM are normal. Right eye exhibits no discharge. Left eye exhibits no discharge. No scleral icterus.  Pupil equal  Neck: Normal range of motion. Neck supple. No JVD present. No tracheal deviation present. No thyromegaly present.  Cardiovascular: Normal rate, regular rhythm, normal heart sounds and intact distal pulses.   No murmur heard. Respiratory: Effort normal and breath sounds normal. No respiratory distress. She has no wheezes. She has no rales. She exhibits no tenderness.  GI: Soft. Bowel sounds are normal. She exhibits no distension and no mass. There is tenderness. There is no rebound and no guarding.  Musculoskeletal: She exhibits no edema or tenderness.  Lymphadenopathy:    She has no cervical adenopathy.  Neurological: She is alert and oriented to person, place, and time. No cranial nerve deficit.  Skin: Skin is warm and dry. No rash noted. She is not diaphoretic. No erythema. No pallor.  Psychiatric: She has a normal mood and affect. Her behavior is normal. Judgment and thought content normal.    Assessment/Plan: Lower abdominal pain, nausea, and diarrhea Cecal and TI inflammation on CT, appendix not seen Possible gastroenteritis   Plan:  Recommend GI panel and C diff. Hydrate, control pain, nausea  and monitor diarrhea over night.  Recheck labs in AM, and we will also reexamine in the AM.  Kimberly Yoder 03/02/2017, 1:43 PM

## 2017-03-02 NOTE — Progress Notes (Signed)
Patient complaining of nausea. Patient does not have medication ordered for nausea. Nurse paged on-call hospitalist to request medication for nausea.

## 2017-03-02 NOTE — ED Notes (Signed)
Report given to Darl Pikes, RN.  She asked patient to arrive around 6:30pm

## 2017-03-02 NOTE — ED Triage Notes (Signed)
Patient reports diarrhea x4-5 days and emesis/generalized abdominal pain starting last night.

## 2017-03-03 DIAGNOSIS — A0472 Enterocolitis due to Clostridium difficile, not specified as recurrent: Secondary | ICD-10-CM | POA: Diagnosis present

## 2017-03-03 LAB — GASTROINTESTINAL PANEL BY PCR, STOOL (REPLACES STOOL CULTURE)
Adenovirus F40/41: NOT DETECTED
Astrovirus: NOT DETECTED
Campylobacter species: NOT DETECTED
Cryptosporidium: NOT DETECTED
Cyclospora cayetanensis: NOT DETECTED
ENTAMOEBA HISTOLYTICA: NOT DETECTED
ENTEROTOXIGENIC E COLI (ETEC): NOT DETECTED
Enteroaggregative E coli (EAEC): NOT DETECTED
Enteropathogenic E coli (EPEC): NOT DETECTED
Giardia lamblia: NOT DETECTED
NOROVIRUS GI/GII: DETECTED — AB
PLESIMONAS SHIGELLOIDES: NOT DETECTED
Rotavirus A: NOT DETECTED
SHIGA LIKE TOXIN PRODUCING E COLI (STEC): NOT DETECTED
Salmonella species: NOT DETECTED
Sapovirus (I, II, IV, and V): NOT DETECTED
Shigella/Enteroinvasive E coli (EIEC): NOT DETECTED
VIBRIO SPECIES: NOT DETECTED
Vibrio cholerae: NOT DETECTED
Yersinia enterocolitica: NOT DETECTED

## 2017-03-03 LAB — CLOSTRIDIUM DIFFICILE BY PCR: Toxigenic C. Difficile by PCR: POSITIVE — AB

## 2017-03-03 MED ORDER — VANCOMYCIN 50 MG/ML ORAL SOLUTION
125.0000 mg | Freq: Four times a day (QID) | ORAL | Status: DC
  Administered 2017-03-03 – 2017-03-05 (×9): 125 mg via ORAL
  Filled 2017-03-03 (×11): qty 2.5

## 2017-03-03 NOTE — Progress Notes (Signed)
Received lab results from GI panel. Patient positive for Norovirus. MD paged. Awaiting orders.

## 2017-03-03 NOTE — Progress Notes (Signed)
Subjective: CC:  Lower abdominal pain, nausea, and diarrhea  No real change from last PM. Her C. difficile is positive and she's been started on oral vancomycin. Still little tender especially after eating. No nausea or vomiting.  Objective: Vital signs in last 24 hours: Temp:  [98.4 F (36.9 C)-100.2 F (37.9 C)] 98.4 F (36.9 C) (04/05 0545) Pulse Rate:  [70-99] 70 (04/05 0545) Resp:  [14-16] 16 (04/05 0545) BP: (102-127)/(63-81) 127/72 (04/05 0545) SpO2:  [99 %-100 %] 100 % (04/05 0545) Last BM Date: 03/02/17 MAXIMUM TEMPERATURE 100.2 No labs this a.m. C. difficile antigen is positive No films this a.m.  Intake/Output from previous day: 04/04 0701 - 04/05 0700 In: 2420 [P.O.:720; I.V.:700; IV Piggyback:1000] Out: 6 [Urine:4; Stool:2] Intake/Output this shift: No intake/output data recorded.  General appearance: alert, cooperative and no distress GI: Soft, few bowel sounds, still tender, no organomegaly.  Lab Results:   Recent Labs  03/02/17 0944  WBC 9.9  HGB 13.3  HCT 38.8  PLT 299    BMET  Recent Labs  03/02/17 0944  NA 137  K 3.6  CL 101  CO2 25  GLUCOSE 101*  BUN 6  CREATININE 0.77  CALCIUM 9.4   PT/INR No results for input(s): LABPROT, INR in the last 72 hours.   Recent Labs Lab 03/02/17 0944  AST 24  ALT 16  ALKPHOS 51  BILITOT 1.1  PROT 8.3*  ALBUMIN 4.5     Lipase     Component Value Date/Time   LIPASE 28 03/02/2017 0944     Studies/Results: Ct Abdomen Pelvis W Contrast  Result Date: 03/02/2017 CLINICAL DATA:  Generalized abdominal pain EXAM: CT ABDOMEN AND PELVIS WITH CONTRAST TECHNIQUE: Multidetector CT imaging of the abdomen and pelvis was performed using the standard protocol following bolus administration of intravenous contrast. Oral contrast was also administered. CONTRAST:  ISOVUE-300 IOPAMIDOL (ISOVUE-300) INJECTION 61% COMPARISON:  October 07, 2014 FINDINGS: Lower chest: Lung bases are clear. Hepatobiliary:  No focal liver lesions are evident. Gallbladder wall is not appreciably thickened. There is no appreciable biliary duct dilatation. Pancreas: There is no pancreatic mass or inflammatory focus. Spleen: No splenic lesions are evident. Adrenals/Urinary Tract: Adrenals appear unremarkable bilaterally. Kidneys bilaterally show no mass or hydronephrosis on either side. No renal or ureteral calculus is appreciable on either side. Urinary bladder is midline with wall thickness within normal limits. Stomach/Bowel: There is localized wall thickening in the cecum. There is no fistula or abscess in this area. There is no other appreciable bowel wall thickening. There is no evident bowel obstruction. No free air or portal venous air. Vascular/Lymphatic: There is no abdominal aortic aneurysm. No vascular lesions are evident. There are a few subcentimeter lymph nodes in the right lower quadrant region. By size criteria, there is no adenopathy abdomen or pelvis. Reproductive: The uterus is retroverted. There is no pelvic mass. A small amount of free fluid is noted in the cul-de-sac region. Other: Appendix is not seen. There is no appreciable periappendiceal inflammatory type change. There is no abscess in the abdomen or pelvis. There is a minimal ventral hernia containing only fat. Musculoskeletal: There are no blastic or lytic bone lesions. There is no intramuscular or abdominal wall lesion. IMPRESSION: There is localized wall thickening in the cecum, likely of inflammatory etiology. No fistula seen. No bowel obstruction. No other bowel wall thickening evident. Note that the appendix is not appreciable on this study. There is not appear to be periappendiceal inflammation. There are a  few subcentimeter lymph nodes in the right lower quadrant which may well have inflammatory etiology given the thickening of the wall of the cecum. Small amount of free fluid in the cul-de-sac. A small amount of fluid may be upper physiologic or could  indicate recent ovarian cyst rupture. It also could result secondary to the inflammation of the cecum. No demonstrable abscess. No renal or ureteral calculus. No hydronephrosis. Electronically Signed   By: Bretta Bang III M.D.   On: 03/02/2017 12:07   Prior to Admission medications   Medication Sig Start Date End Date Taking? Authorizing Provider  acetaminophen (TYLENOL) 500 MG tablet Take 500 mg by mouth every 6 (six) hours as needed for moderate pain or headache.   Yes Historical Provider, MD  ibuprofen (ADVIL,MOTRIN) 800 MG tablet Take 1 tablet (800 mg total) by mouth 3 (three) times daily. Patient taking differently: Take 800 mg by mouth every 8 (eight) hours as needed for moderate pain.  01/29/17  Yes Garlon Hatchet, PA-C  clindamycin (CLEOCIN) 150 MG capsule Take 2 capsules (300 mg total) by mouth 3 (three) times daily. May dispense as  capsules Patient not taking: Reported on 03/02/2017 01/29/17   Garlon Hatchet, PA-C  famotidine (PEPCID) 20 MG tablet Take 1 tablet (20 mg total) by mouth 2 (two) times daily. Patient not taking: Reported on 01/30/2017 05/13/14   Jaynie Crumble, PA-C  HYDROcodone-acetaminophen (NORCO) 5-325 MG tablet Take 1 tablet by mouth every 6 (six) hours as needed for severe pain. Patient not taking: Reported on 01/30/2017 07/21/16   Kellogg, PA-C  HYDROcodone-acetaminophen (NORCO/VICODIN) 5-325 MG per tablet Take 1 tablet by mouth every 4 (four) hours as needed for moderate pain or severe pain. Patient not taking: Reported on 01/30/2017 10/07/14   Everlene Farrier, PA-C  hydrOXYzine (ATARAX/VISTARIL) 25 MG tablet Take 1 tablet (25 mg total) by mouth every 4 (four) hours as needed. Patient not taking: Reported on 01/30/2017 05/13/14   Tatyana Kirichenko, PA-C  metroNIDAZOLE (FLAGYL) 500 MG tablet Take 1 tablet (500 mg total) by mouth 2 (two) times daily. Patient not taking: Reported on 01/30/2017 10/07/14   Everlene Farrier, PA-C  naproxen (NAPROSYN) 500 MG tablet Take 1  tablet (500 mg total) by mouth 2 (two) times daily as needed for mild pain, moderate pain or headache (TAKE WITH MEALS.). Patient not taking: Reported on 01/30/2017 07/21/16   Mercedes Street, PA-C  predniSONE (DELTASONE) 20 MG tablet Take 2 tablets (40 mg total) by mouth daily. Patient not taking: Reported on 01/30/2017 05/13/14   Jaynie Crumble, PA-C  promethazine (PHENERGAN) 25 MG tablet Take 1 tablet (25 mg total) by mouth every 6 (six) hours as needed for nausea or vomiting. Patient not taking: Reported on 01/30/2017 10/07/14   Everlene Farrier, PA-C    Medications: . enoxaparin (LOVENOX) injection  40 mg Subcutaneous Q24H  . ondansetron  4 mg Oral Q6H   Or  . ondansetron (ZOFRAN) IV  4 mg Intravenous Q6H  . vancomycin  125 mg Oral Q6H   . sodium chloride 100 mL/hr at 03/03/17 1610    Assessment/Plan Lower abdominal pain, nausea, and diarrhea C. difficile positive Cecal and TI inflammation on CT, appendix not seen Possible gastroenteritis  FEN: IV fluids/clear liquids ID:  By mouth vancomycin DVT: Lovenox  Plan: Patient's been started on oral vancomycin. Will be available please call if we can be of further assistance.   LOS: 0 days    Sheridan Gettel 03/03/2017 [phone]

## 2017-03-03 NOTE — Progress Notes (Signed)
[facility] PROGRESS NOTE  Kimberly Yoder ZOX:[id number] DOB: [birth year] DOA: 03/02/2017  PCP: No PCP Per Patient  Brief History/Interval Summary: 31 year old female with a recent right buttock abscess s/p I and D in the ER last month. Given a course of Clinda (3/5 - 3/12) with improvement. She subsequently developed diarrhea around March 25. And then developed nausea and vomiting. She also had fever. She presented to the emergency department with these complaints. CT scan showed evidence for cecal inflammation. She was hospitalized for further management.  Reason for Visit: C. difficile colitis  Consultants: Gen. surgery  Procedures: None  Antibiotics: Oral vancomycin  Subjective/Interval History: Patient had 2 episodes of loose stool last night. Also had some nausea but no further episodes of vomiting. Continues to have abdominal pain in the lower abdomen.  ROS: Denies any chest pain or shortness of breath  Objective:  Vital Signs  Vitals:   03/02/17 1822 03/02/17 1855 03/02/17 2110 03/03/17 0545  BP: 124/81 102/63 117/76 127/72  Pulse: 99 88 83 70  Resp: Temp: 99.8 F (37.7 C) 100.2 F (37.9 C) 99.1 F (37.3 C) 98.4 F (36.9 C)  TempSrc: Oral Oral Oral Oral  SpO2: 100% 100% 100% 100%  Weight:      Height:        Intake/Output Summary (Last 24 hours) at 03/03/17 1115 Last data filed at 03/03/17 1022  Gross per 24 hour  Intake             2780 ml  Output                8 ml  Net             2772 ml   Filed Weights   03/02/17 0859  Weight: 81.2 kg (179 lb)    General appearance: alert, cooperative, appears stated age and no distress Resp: clear to auscultation bilaterally Cardio: regular rate and rhythm, S1, S2 normal, no murmur, click, rub or gallop GI: Abdomen is soft. Tender in the lower abdomen in both sides without any rebound, rigidity or guarding. No masses or organomegaly. Bowel sounds are present. Extremities: extremities  normal, atraumatic, no cyanosis or edema Neurologic: Awake and alert. Oriented 3. No focal neurological deficits are noted.  Lab Results:  Data Reviewed: I have personally reviewed following labs and imaging studies  CBC:  Recent Labs Lab 03/02/17 0944  WBC 9.9  HGB 13.3  HCT 38.8  MCV 79.7  PLT 299    Basic Metabolic Panel:  Recent Labs Lab 03/02/17 0944  NA 137  K 3.6  CL 101  CO2 25  GLUCOSE 101*  BUN 6  CREATININE 0.77  CALCIUM 9.4    GFR: Estimated Creatinine Clearance: 107.3 mL/min (by C-G formula based on SCr of 0.77 mg/dL).  Liver Function Tests:  Recent Labs Lab 03/02/17 0944  AST 24  ALT 16  ALKPHOS 51  BILITOT 1.1  PROT 8.3*  ALBUMIN 4.5     Recent Labs Lab 03/02/17 0944  LIPASE 28     Recent Results (from the past 240 hour(s))  C difficile quick scan w PCR reflex     Status: Abnormal   Collection Time: 03/02/17  8:34 PM  Result Value Ref Range Status   C Diff antigen POSITIVE (A) NEGATIVE Final   C Diff toxin NEGATIVE NEGATIVE Final   C Diff interpretation Results are indeterminate. See PCR results.  Final  Clostridium Difficile by PCR  Status: Abnormal   Collection Time: 03/02/17  8:34 PM  Result Value Ref Range Status   Toxigenic C Difficile by pcr POSITIVE (A) NEGATIVE Final    Comment: Positive for toxigenic C. difficile with little to no toxin production. Only treat if clinical presentation suggests symptomatic illness. Performed at [facility], [address], [place], [place] [address]       Radiology Studies: Ct Abdomen Pelvis W Contrast  Result Date: 03/02/2017 CLINICAL DATA:  Generalized abdominal pain EXAM: CT ABDOMEN AND PELVIS WITH CONTRAST TECHNIQUE: Multidetector CT imaging of the abdomen and pelvis was performed using the standard protocol following bolus administration of intravenous contrast. Oral contrast was also administered. CONTRAST:  ISOVUE-300 IOPAMIDOL (ISOVUE-300) INJECTION 61%  COMPARISON:  October 07, 2014 FINDINGS: Lower chest: Lung bases are clear. Hepatobiliary: No focal liver lesions are evident. Gallbladder wall is not appreciably thickened. There is no appreciable biliary duct dilatation. Pancreas: There is no pancreatic mass or inflammatory focus. Spleen: No splenic lesions are evident. Adrenals/Urinary Tract: Adrenals appear unremarkable bilaterally. Kidneys bilaterally show no mass or hydronephrosis on either side. No renal or ureteral calculus is appreciable on either side. Urinary bladder is midline with wall thickness within normal limits. Stomach/Bowel: There is localized wall thickening in the cecum. There is no fistula or abscess in this area. There is no other appreciable bowel wall thickening. There is no evident bowel obstruction. No free air or portal venous air. Vascular/Lymphatic: There is no abdominal aortic aneurysm. No vascular lesions are evident. There are a few subcentimeter lymph nodes in the right lower quadrant region. By size criteria, there is no adenopathy abdomen or pelvis. Reproductive: The uterus is retroverted. There is no pelvic mass. A small amount of free fluid is noted in the cul-de-sac region. Other: Appendix is not seen. There is no appreciable periappendiceal inflammatory type change. There is no abscess in the abdomen or pelvis. There is a minimal ventral hernia containing only fat. Musculoskeletal: There are no blastic or lytic bone lesions. There is no intramuscular or abdominal wall lesion. IMPRESSION: There is localized wall thickening in the cecum, likely of inflammatory etiology. No fistula seen. No bowel obstruction. No other bowel wall thickening evident. Note that the appendix is not appreciable on this study. There is not appear to be periappendiceal inflammation. There are a few subcentimeter lymph nodes in the right lower quadrant which may well have inflammatory etiology given the thickening of the wall of the cecum. Small amount  of free fluid in the cul-de-sac. A small amount of fluid may be upper physiologic or could indicate recent ovarian cyst rupture. It also could result secondary to the inflammation of the cecum. No demonstrable abscess. No renal or ureteral calculus. No hydronephrosis. Electronically Signed   By: Bretta Bang III M.D.   On: 03/02/2017 12:07     Medications:  Scheduled: . enoxaparin (LOVENOX) injection  40 mg Subcutaneous Q24H  . ondansetron  4 mg Oral Q6H   Or  . ondansetron (ZOFRAN) IV  4 mg Intravenous Q6H  . vancomycin  125 mg Oral Q6H   Continuous: . sodium chloride 100 mL/hr at 03/03/17 6045   WUJ:[id number] **OR** acetaminophen, morphine injection, ondansetron (ZOFRAN) IV, oxyCODONE  Assessment/Plan:  Principal Problem:   Colitis    Colitis, most likely secondary to C. Difficile Patient was given clindamycin for an abscess a few weeks ago. GI symptoms started about a week prior to hospitalization. Stool studies were sent. She is C. difficile antigen  positive, PCR positive, but toxin negative. However, considering her history and presentation, she likely has acute infection and will benefit from treatment. She will be initiated on oral vancomycin. Appreciate general surgery input. Pain management. Continue clear liquids for now.  DVT Prophylaxis: Lovenox    Code Status: Full code  Family Communication: Discussed with patient  Disposition Plan: Management as outlined above. Mobilize as tolerated.    LOS: 0 days   [facility]  [facility] Pager [phone] 03/03/2017, 11:15 AM  If 7PM-7AM, please contact night-coverage at [email or web address], password [email or web address]

## 2017-03-04 LAB — CBC
HEMATOCRIT: 34.2 % — AB (ref 36.0–46.0)
Hemoglobin: 11.2 g/dL — ABNORMAL LOW (ref 12.0–15.0)
MCH: 26.4 pg (ref 26.0–34.0)
MCHC: 32.7 g/dL (ref 30.0–36.0)
MCV: 80.7 fL (ref 78.0–100.0)
Platelets: 250 10*3/uL (ref 150–400)
RBC: 4.24 MIL/uL (ref 3.87–5.11)
RDW: 15.6 % — AB (ref 11.5–15.5)
WBC: 4.9 10*3/uL (ref 4.0–10.5)

## 2017-03-04 LAB — BASIC METABOLIC PANEL
Anion gap: 7 (ref 5–15)
BUN: <5 mg/dL — ABNORMAL LOW (ref 6–20)
CALCIUM: 8.2 mg/dL — AB (ref 8.9–10.3)
CO2: 23 mmol/L (ref 22–32)
CREATININE: 0.74 mg/dL (ref 0.44–1.00)
Chloride: 111 mmol/L (ref 101–111)
GFR calc Af Amer: >60 mL/min (ref >60)
GFR calc non Af Amer: >60 mL/min (ref >60)
Glucose, Bld: 91 mg/dL (ref 65–99)
Potassium: 3.5 mmol/L (ref 3.5–5.1)
Sodium: 141 mmol/L (ref 135–145)

## 2017-03-04 MED ORDER — ORAL CARE MOUTH RINSE
15.0000 mL | Freq: Two times a day (BID) | OROMUCOSAL | Status: DC
  Administered 2017-03-04 – 2017-03-05 (×3): 15 mL via OROMUCOSAL

## 2017-03-04 MED ORDER — CHLORHEXIDINE GLUCONATE 0.12 % MT SOLN
15.0000 mL | Freq: Two times a day (BID) | OROMUCOSAL | Status: DC
  Administered 2017-03-04 – 2017-03-05 (×3): 15 mL via OROMUCOSAL
  Filled 2017-03-04 (×3): qty 15

## 2017-03-04 MED ORDER — POTASSIUM CHLORIDE CRYS ER 20 MEQ PO TBCR
40.0000 meq | EXTENDED_RELEASE_TABLET | Freq: Once | ORAL | Status: AC
  Administered 2017-03-04: 40 meq via ORAL
  Filled 2017-03-04: qty 2

## 2017-03-04 MED ORDER — ORAL CARE MOUTH RINSE: 15.0000 mL | Freq: Two times a day (BID) | OROMUCOSAL | Status: DC

## 2017-03-04 NOTE — Progress Notes (Signed)
[facility] PROGRESS NOTE  TAJUANNA BURNETT JXB:[id number] DOB: June 27, 1986 DOA: 03/02/2017  PCP: No PCP Per Patient  Brief History/Interval Summary: 31 year old female with a recent right buttock abscess s/p I and D in the ER last month. Given a course of Clinda (3/5 - 3/12) with improvement. She subsequently developed diarrhea around March 25. And then developed nausea and vomiting. She also had fever. She presented to the emergency department with these complaints. CT scan showed evidence for cecal inflammation. She was hospitalized for further management.  Reason for Visit: C. difficile colitis  Consultants: Gen. surgery  Procedures: None  Antibiotics: Oral vancomycin  Subjective/Interval History: Patient has had only one episode of loose stools and 6 morning. Abdominal pain has improved. Denies any episodes of nausea or vomiting.   ROS: Denies any chest pain or shortness of breath  Objective:  Vital Signs  Vitals:   03/03/17 0545 03/03/17 1345 03/03/17 2108 03/04/17 0637  BP: 127/72 113/66 123/73 103/73  Pulse: 70 76 73 76  Resp: Temp: 98.4 F (36.9 C) 98.2 F (36.8 C) 98.7 F (37.1 C) 98.3 F (36.8 C)  TempSrc: Oral Oral Oral Oral  SpO2: 100% 100% 100% 100%  Weight:      Height:        Intake/Output Summary (Last 24 hours) at 03/04/17 0931 Last data filed at 03/04/17 1308  Gross per 24 hour  Intake             4100 ml  Output              150 ml  Net             3950 ml   Filed Weights   03/02/17 0859  Weight: 81.2 kg (179 lb)    General appearance: alert, cooperative, appears stated age and no distress Resp: clear to auscultation bilaterally Cardio: regular rate and rhythm, S1, S2 normal, no murmur, click, rub or gallop GI: Abdomen is soft. Improved tenderness in the abdomen. No masses or organomegaly. Bowel sounds are present. Neurologic: Awake and alert. Oriented 3. No focal neurological deficits are noted.  Lab Results:  Data  Reviewed: I have personally reviewed following labs and imaging studies  CBC:  Recent Labs Lab 03/02/17 0944 03/04/17 0521  WBC 9.9 4.9  HGB 13.3 11.2*  HCT 38.8 34.2*  MCV 79.7 80.7  PLT 299 250    Basic Metabolic Panel:  Recent Labs Lab 03/02/17 0944 03/04/17 0521  NA 137 141  K 3.6 3.5  CL 101 111  CO2 25 23  GLUCOSE 101* 91  BUN 6 <5*  CREATININE 0.77 0.74  CALCIUM 9.4 8.2*    GFR: Estimated Creatinine Clearance: 107.3 mL/min (by C-G formula based on SCr of 0.74 mg/dL).  Liver Function Tests:  Recent Labs Lab 03/02/17 0944  AST 24  ALT 16  ALKPHOS 51  BILITOT 1.1  PROT 8.3*  ALBUMIN 4.5     Recent Labs Lab 03/02/17 0944  LIPASE 28     Recent Results (from the past 240 hour(s))  Gastrointestinal Panel by PCR , Stool     Status: Abnormal   Collection Time: 03/02/17  8:34 PM  Result Value Ref Range Status   Campylobacter species NOT DETECTED NOT DETECTED Final   Plesimonas shigelloides NOT DETECTED NOT DETECTED Final   Salmonella species NOT DETECTED NOT DETECTED Final   Yersinia enterocolitica NOT DETECTED NOT DETECTED Final   Vibrio species NOT DETECTED NOT DETECTED  Final   Vibrio cholerae NOT DETECTED NOT DETECTED Final   Enteroaggregative E coli (EAEC) NOT DETECTED NOT DETECTED Final   Enteropathogenic E coli (EPEC) NOT DETECTED NOT DETECTED Final   Enterotoxigenic E coli (ETEC) NOT DETECTED NOT DETECTED Final   Shiga like toxin producing E coli (STEC) NOT DETECTED NOT DETECTED Final   Shigella/Enteroinvasive E coli (EIEC) NOT DETECTED NOT DETECTED Final   Cryptosporidium NOT DETECTED NOT DETECTED Final   Cyclospora cayetanensis NOT DETECTED NOT DETECTED Final   Entamoeba histolytica NOT DETECTED NOT DETECTED Final   Giardia lamblia NOT DETECTED NOT DETECTED Final   Adenovirus F40/41 NOT DETECTED NOT DETECTED Final   Astrovirus NOT DETECTED NOT DETECTED Final   Norovirus GI/GII DETECTED (A) NOT DETECTED Final    Comment: RESULT CALLED  TO, READ BACK BY AND VERIFIED WITH: MACY SIMMONS 03/03/17 1444 KLW    Rotavirus A NOT DETECTED NOT DETECTED Final   Sapovirus (I, II, IV, and V) NOT DETECTED NOT DETECTED Final  C difficile quick scan w PCR reflex     Status: Abnormal   Collection Time: 03/02/17  8:34 PM  Result Value Ref Range Status   C Diff antigen POSITIVE (A) NEGATIVE Final   C Diff toxin NEGATIVE NEGATIVE Final   C Diff interpretation Results are indeterminate. See PCR results.  Final  Clostridium Difficile by PCR     Status: Abnormal   Collection Time: 03/02/17  8:34 PM  Result Value Ref Range Status   Toxigenic C Difficile by pcr POSITIVE (A) NEGATIVE Final    Comment: Positive for toxigenic C. difficile with little to no toxin production. Only treat if clinical presentation suggests symptomatic illness. Performed at [facility], [address] [place], [place] [address]       Radiology Studies: Ct Abdomen Pelvis W Contrast  Result Date: 03/02/2017 CLINICAL DATA:  Generalized abdominal pain EXAM: CT ABDOMEN AND PELVIS WITH CONTRAST TECHNIQUE: Multidetector CT imaging of the abdomen and pelvis was performed using the standard protocol following bolus administration of intravenous contrast. Oral contrast was also administered. CONTRAST:  ISOVUE-300 IOPAMIDOL (ISOVUE-300) INJECTION 61% COMPARISON:  October 07, 2014 FINDINGS: Lower chest: Lung bases are clear. Hepatobiliary: No focal liver lesions are evident. Gallbladder wall is not appreciably thickened. There is no appreciable biliary duct dilatation. Pancreas: There is no pancreatic mass or inflammatory focus. Spleen: No splenic lesions are evident. Adrenals/Urinary Tract: Adrenals appear unremarkable bilaterally. Kidneys bilaterally show no mass or hydronephrosis on either side. No renal or ureteral calculus is appreciable on either side. Urinary bladder is midline with wall thickness within normal limits. Stomach/Bowel: There is localized wall thickening  in the cecum. There is no fistula or abscess in this area. There is no other appreciable bowel wall thickening. There is no evident bowel obstruction. No free air or portal venous air. Vascular/Lymphatic: There is no abdominal aortic aneurysm. No vascular lesions are evident. There are a few subcentimeter lymph nodes in the right lower quadrant region. By size criteria, there is no adenopathy abdomen or pelvis. Reproductive: The uterus is retroverted. There is no pelvic mass. A small amount of free fluid is noted in the cul-de-sac region. Other: Appendix is not seen. There is no appreciable periappendiceal inflammatory type change. There is no abscess in the abdomen or pelvis. There is a minimal ventral hernia containing only fat. Musculoskeletal: There are no blastic or lytic bone lesions. There is no intramuscular or abdominal wall lesion. IMPRESSION: There is localized wall thickening in  the cecum, likely of inflammatory etiology. No fistula seen. No bowel obstruction. No other bowel wall thickening evident. Note that the appendix is not appreciable on this study. There is not appear to be periappendiceal inflammation. There are a few subcentimeter lymph nodes in the right lower quadrant which may well have inflammatory etiology given the thickening of the wall of the cecum. Small amount of free fluid in the cul-de-sac. A small amount of fluid may be upper physiologic or could indicate recent ovarian cyst rupture. It also could result secondary to the inflammation of the cecum. No demonstrable abscess. No renal or ureteral calculus. No hydronephrosis. Electronically Signed   By: Bretta Bang III M.D.   On: 03/02/2017 12:07     Medications:  Scheduled: . chlorhexidine  15 mL Mouth Rinse BID  . enoxaparin (LOVENOX) injection  40 mg Subcutaneous Q24H  . mouth rinse  15 mL Mouth Rinse q12n4p  . ondansetron  4 mg Oral Q6H   Or  . ondansetron (ZOFRAN) IV  4 mg Intravenous Q6H  . potassium chloride  40  mEq Oral Once  . vancomycin  125 mg Oral Q6H   Continuous: . sodium chloride 100 mL/hr at 03/03/17 2012   UJW:[id number] **OR** acetaminophen, morphine injection, ondansetron (ZOFRAN) IV, oxyCODONE  Assessment/Plan:  Principal Problem:   Colitis Active Problems:   C. difficile colitis    Colitis, most likely secondary to C. Difficile/positive neurovirus Patient was given clindamycin for an abscess a few weeks ago. GI symptoms started about a week prior to hospitalization. Stool studies were sent. She is C. difficile antigen positive, PCR positive, but toxin negative. However, considering her history and presentation, she was thought to have acute infection. She was started on oral vancomycin. Diarrhea is improved. Interestingly, her GI pathogen panel is positive for neurovirus as well. She denies any recent sick contacts. This is a puzzling scenario. However, considering improvement with oral vancomycin, we will continue the same. Advance diet.  DVT Prophylaxis: Lovenox    Code Status: Full code  Family Communication: Discussed with patient  Disposition Plan: Management as outlined above. Mobilize as tolerated. Anticipate discharge tomorrow.    LOS: 1 day   [facility]  [facility] Pager [phone] 03/04/2017, 9:31 AM  If 7PM-7AM, please contact night-coverage at [email or web address], password [facility]

## 2017-03-05 LAB — CBC
HCT: 35.7 % — ABNORMAL LOW (ref 36.0–46.0)
Hemoglobin: 11.9 g/dL — ABNORMAL LOW (ref 12.0–15.0)
MCH: 27.5 pg (ref 26.0–34.0)
MCHC: 33.3 g/dL (ref 30.0–36.0)
MCV: 82.4 fL (ref 78.0–100.0)
PLATELETS: 263 10*3/uL (ref 150–400)
RBC: 4.33 MIL/uL (ref 3.87–5.11)
RDW: 15.5 % (ref 11.5–15.5)
WBC: 5.2 10*3/uL (ref 4.0–10.5)

## 2017-03-05 LAB — BASIC METABOLIC PANEL
Anion gap: 6 (ref 5–15)
CO2: 25 mmol/L (ref 22–32)
CREATININE: 0.71 mg/dL (ref 0.44–1.00)
Calcium: 8.8 mg/dL — ABNORMAL LOW (ref 8.9–10.3)
Chloride: 107 mmol/L (ref 101–111)
GFR calc Af Amer: >60 mL/min (ref >60)
GFR calc non Af Amer: >60 mL/min (ref >60)
GLUCOSE: 92 mg/dL (ref 65–99)
POTASSIUM: 3.7 mmol/L (ref 3.5–5.1)
SODIUM: 138 mmol/L (ref 135–145)

## 2017-03-05 MED ORDER — VANCOMYCIN HCL 125 MG PO CAPS: 125.0000 mg | ORAL_CAPSULE | Freq: Four times a day (QID) | ORAL | 0 refills | Status: AC

## 2017-03-05 NOTE — Care Management Note (Addendum)
31 yo F with a R buttock abscess. s/p I & D in the ER last month. She subsequently developed diarrhea around March 25. And then developed nausea and vomiting. She also had fever. She presented to the ER with these complaints. CT scan showed evidence for cecal inflammation.  Received call from RN stating that pt is ready to be D/C and she needs help with her meds.  Assisted pt with her meds through the [facility] program.

## 2017-03-05 NOTE — Progress Notes (Signed)
Discharged from floor via w/c for transport home by car. Belongings & family with pt. No changes in assessment. Kimberly Yoder  

## 2017-03-05 NOTE — Discharge Instructions (Signed)
Clostridium Difficile Infection  Clostridium difficile (C. difficile or C. diff) infection is a condition that causes inflammation of the large intestine (colon). This condition can result in damage to the lining of your colon and may lead to colitis. This infection can be passed from person to person (is contagious).  What are the causes?  C. diff is a bacterium that is normally found in the colon. This infection is caused when the balance of C. diff is changed and there is an overgrowth of C. diff. This is often caused by antibiotic use.  What increases the risk?  This condition is more likely to develop in people who:  · Take antibiotic medicines.  · Take a certain type of medicine called proton pump inhibitors over a long period of time (chronic use).  · Are older.  · Have had a C. diff infection before.  · Have serious underlying conditions, such as colon cancer.  · Are in the hospital.  · Have a weak defense (immune) system.  · Live in a place where there is a lot of contact with others, such as a nursing home.  · Have had gastrointestinal (GI) tract surgery.    What are the signs or symptoms?  Symptoms of this condition include:  · Diarrhea. This may be bloody, watery, or yellow or green in color.  · Fever.  · Fatigue.  · Loss of appetite.  · Nausea.  · Swelling, pain, or tenderness in the abdomen.  · Dehydration. Dehydration can cause you to be tired and thirsty, have a dry mouth, and urinate less frequently.    How is this diagnosed?  This condition is diagnosed with a medical history and physical exam. You may also have tests, including:  · A test that checks for C. diff in your stool.  · Blood tests.  · A sigmoidoscopy or colonoscopy to look at your colon. These procedures involve passing an instrument through your rectum to look at the inside of your colon.    How is this treated?  Treatment for this condition includes:  · Antibiotics that keep C. diff from growing.  · Stopping the antibiotics you were  on before the C. diff infection began. Only do this as told by your health care provider.  · Fluids through an IV tube, if you are dehydrated.  · Surgery to remove the infected part of the colon. This is rare.    Follow these instructions at home:  Eating and drinking  · Drink enough fluid to keep your urine clear or pale yellow. Avoid milk, caffeine, and alcohol.  · Follow specific rehydration instructions as told by your health care provider.  · Eat small, frequent meals instead of large meals.  Medicines  · Take your antibiotic medicine as told by your health care provider. Do not stop taking the antibiotic even if you start to feel better unless your health care provider told you to do that.  · Take over-the-counter and prescription medicines only as told by your health care provider.  · Do not use medicines to help with diarrhea.  General instructions  · Wash your hands thoroughly before you prepare food and after you use the bathroom. Make sure people who live with you also wash their hands often.  · Clean surfaces that you touch with a product that contains chlorine bleach.  · Keep all follow-up visits as told by your health care provider. This is important.  Contact a health care provider if:  ·   Your symptoms do not get better with treatment.  · Your symptoms get worse with treatment.  · Your symptoms go away and then return.  · You have a fever.  · You have new symptoms.  Get help right away if:  · You have increasing pain or tenderness in your abdomen.  · You have stool that is mostly bloody, or your stool looks dark black and tarry.  · You cannot eat or drink without vomiting.  · You have signs of dehydration, such as:  ? Dark urine, very little urine, or no urine.  ? Cracked lips.  ? Not making tears when you cry.  ? Dry mouth.  ? Sunken eyes.  ? Sleepiness.  ? Weakness.  ? Dizziness.  This information is not intended to replace advice given to you by your health care provider. Make sure you discuss any  questions you have with your health care provider.  Document Released: 08/25/2005 Document Revised: 04/22/2016 Document Reviewed: 05/19/2015  Elsevier Interactive Patient Education © 2017 [employer]

## 2017-03-05 NOTE — Discharge Summary (Signed)
[facility]  Physician Discharge Summary   Patient ID: Kimberly Yoder MRN: [id number] DOB/AGE: 06-01-1986 31 y.o.  Admit date: 03/02/2017 Discharge date: 03/05/2017  PCP: No PCP Per Patient  DISCHARGE DIAGNOSES:  Principal Problem:   Colitis Active Problems:   C. difficile colitis   RECOMMENDATIONS FOR OUTPATIENT FOLLOW UP: 1. Patient instructed to follow-up with her primary care provider   DISCHARGE CONDITION: fair  Diet recommendation: Soft diet  Filed Weights   03/02/17 0859  Weight: 81.2 kg (179 lb)    INITIAL HISTORY: 31 year old female with a recent right buttock abscess s/p I and D in the ER last month. Given a course of Clinda (3/5 - 3/12) with improvement. She subsequently developed diarrhea around March 25. And then developed nausea and vomiting. She also had fever. She presented to the emergency department with these complaints. CT scan showed evidence for cecal inflammation. She was hospitalized for further management.  Consultations:  General surgery  Procedures:  None  HOSPITAL COURSE:   Colitis, most likely secondary to C. Difficile/positive neurovirus Patient was given clindamycin for an abscess a few weeks ago. GI symptoms started about a week prior to hospitalization. Due to findings on CT scan general surgery was also consulted. They recommended stool studies. She is C. difficile antigen positive, PCR positive, but toxin negative. However, considering her history and presentation, she was thought to have acute c diff infection. She was started on oral vancomycin. Diarrhea has improved. Interestingly, her GI pathogen panel is positive for neurovirus as well. She denies any recent sick contacts. This is a puzzling scenario. However, considering improvement with oral vancomycin, we will continue the same for a total of 10 days. Diet was advanced, which tolerated. She was ambulated in the hallway.  Patient has improved significantly. Okay for  discharge home. Case management was consulted to help with medications.   PERTINENT LABS:  The results of significant diagnostics from this hospitalization (including imaging, microbiology, ancillary and laboratory) are listed below for reference.    Microbiology: Recent Results (from the past 240 hour(s))  Gastrointestinal Panel by PCR , Stool     Status: Abnormal   Collection Time: 03/02/17  8:34 PM  Result Value Ref Range Status   Campylobacter species NOT DETECTED NOT DETECTED Final   Plesimonas shigelloides NOT DETECTED NOT DETECTED Final   Salmonella species NOT DETECTED NOT DETECTED Final   Yersinia enterocolitica NOT DETECTED NOT DETECTED Final   Vibrio species NOT DETECTED NOT DETECTED Final   Vibrio cholerae NOT DETECTED NOT DETECTED Final   Enteroaggregative E coli (EAEC) NOT DETECTED NOT DETECTED Final   Enteropathogenic E coli (EPEC) NOT DETECTED NOT DETECTED Final   Enterotoxigenic E coli (ETEC) NOT DETECTED NOT DETECTED Final   Shiga like toxin producing E coli (STEC) NOT DETECTED NOT DETECTED Final   Shigella/Enteroinvasive E coli (EIEC) NOT DETECTED NOT DETECTED Final   Cryptosporidium NOT DETECTED NOT DETECTED Final   Cyclospora cayetanensis NOT DETECTED NOT DETECTED Final   Entamoeba histolytica NOT DETECTED NOT DETECTED Final   Giardia lamblia NOT DETECTED NOT DETECTED Final   Adenovirus F40/41 NOT DETECTED NOT DETECTED Final   Astrovirus NOT DETECTED NOT DETECTED Final   Norovirus GI/GII DETECTED (A) NOT DETECTED Final    Comment: RESULT CALLED TO, READ BACK BY AND VERIFIED WITH: MACY SIMMONS 03/03/17 1444 KLW    Rotavirus A NOT DETECTED NOT DETECTED Final   Sapovirus (I, II, IV, and V) NOT DETECTED NOT DETECTED Final  C difficile quick scan  w PCR reflex     Status: Abnormal   Collection Time: 03/02/17  8:34 PM  Result Value Ref Range Status   C Diff antigen POSITIVE (A) NEGATIVE Final   C Diff toxin NEGATIVE NEGATIVE Final   C Diff interpretation Results  are indeterminate. See PCR results.  Final  Clostridium Difficile by PCR     Status: Abnormal   Collection Time: 03/02/17  8:34 PM  Result Value Ref Range Status   Toxigenic C Difficile by pcr POSITIVE (A) NEGATIVE Final    Comment: Positive for toxigenic C. difficile with little to no toxin production. Only treat if clinical presentation suggests symptomatic illness. Performed at [facility], [address] [place], [place] [address]      Labs: Basic Metabolic Panel:  Recent Labs Lab 03/02/17 0944 03/04/17 0521 03/05/17 0500  NA 137 141 138  K 3.6 3.5 3.7  CL 101 111 107  CO2 GLUCOSE 101* 91 92  BUN 6 <5* <5*  CREATININE 0.77 0.74 0.71  CALCIUM 9.4 8.2* 8.8*   Liver Function Tests:  Recent Labs Lab 03/02/17 0944  AST 24  ALT 16  ALKPHOS 51  BILITOT 1.1  PROT 8.3*  ALBUMIN 4.5    Recent Labs Lab 03/02/17 0944  LIPASE 28   CBC:  Recent Labs Lab 03/02/17 0944 03/04/17 0521 03/05/17 0500  WBC 9.9 4.9 5.2  HGB 13.3 11.2* 11.9*  HCT 38.8 34.2* 35.7*  MCV 79.7 80.7 82.4  PLT 299 250 263    IMAGING STUDIES Ct Abdomen Pelvis W Contrast  Result Date: 03/02/2017 CLINICAL DATA:  Generalized abdominal pain EXAM: CT ABDOMEN AND PELVIS WITH CONTRAST TECHNIQUE: Multidetector CT imaging of the abdomen and pelvis was performed using the standard protocol following bolus administration of intravenous contrast. Oral contrast was also administered. CONTRAST:  ISOVUE-300 IOPAMIDOL (ISOVUE-300) INJECTION 61% COMPARISON:  October 07, 2014 FINDINGS: Lower chest: Lung bases are clear. Hepatobiliary: No focal liver lesions are evident. Gallbladder wall is not appreciably thickened. There is no appreciable biliary duct dilatation. Pancreas: There is no pancreatic mass or inflammatory focus. Spleen: No splenic lesions are evident. Adrenals/Urinary Tract: Adrenals appear unremarkable bilaterally. Kidneys bilaterally show no mass or hydronephrosis on either  side. No renal or ureteral calculus is appreciable on either side. Urinary bladder is midline with wall thickness within normal limits. Stomach/Bowel: There is localized wall thickening in the cecum. There is no fistula or abscess in this area. There is no other appreciable bowel wall thickening. There is no evident bowel obstruction. No free air or portal venous air. Vascular/Lymphatic: There is no abdominal aortic aneurysm. No vascular lesions are evident. There are a few subcentimeter lymph nodes in the right lower quadrant region. By size criteria, there is no adenopathy abdomen or pelvis. Reproductive: The uterus is retroverted. There is no pelvic mass. A small amount of free fluid is noted in the cul-de-sac region. Other: Appendix is not seen. There is no appreciable periappendiceal inflammatory type change. There is no abscess in the abdomen or pelvis. There is a minimal ventral hernia containing only fat. Musculoskeletal: There are no blastic or lytic bone lesions. There is no intramuscular or abdominal wall lesion. IMPRESSION: There is localized wall thickening in the cecum, likely of inflammatory etiology. No fistula seen. No bowel obstruction. No other bowel wall thickening evident. Note that the appendix is not appreciable on this study. There is not appear to be periappendiceal inflammation. There are a few subcentimeter  lymph nodes in the right lower quadrant which may well have inflammatory etiology given the thickening of the wall of the cecum. Small amount of free fluid in the cul-de-sac. A small amount of fluid may be upper physiologic or could indicate recent ovarian cyst rupture. It also could result secondary to the inflammation of the cecum. No demonstrable abscess. No renal or ureteral calculus. No hydronephrosis. Electronically Signed   By: Bretta Bang III M.D.   On: 03/02/2017 12:07    DISCHARGE EXAMINATION: Vitals:   03/04/17 0637 03/04/17 1458 03/04/17 2209 03/05/17 0512  BP:  103/73 118/78 123/82 (!) 105/49  Pulse: 76 70 67 (!) 57  Resp: 16 16    Temp: 98.3 F (36.8 C) 98.2 F (36.8 C) 98.4 F (36.9 C) 98.2 F (36.8 C)  TempSrc: Oral Oral Oral Oral  SpO2: 100% 100% 99% 100%  Weight:      Height:       General appearance: alert, cooperative, appears stated age and no distress Resp: clear to auscultation bilaterally Cardio: regular rate and rhythm, S1, S2 normal, no murmur, click, rub or gallop GI: soft, tenderness has resolved. No masses or organomegaly. Bowel sounds are present. Extremities: extremities normal, atraumatic, no cyanosis or edema  DISPOSITION: Home  Discharge Instructions    Call MD for:  difficulty breathing, headache or visual disturbances    Complete by:  As directed    Call MD for:  extreme fatigue    Complete by:  As directed    Call MD for:  persistant dizziness or light-headedness    Complete by:  As directed    Call MD for:  persistant nausea and vomiting    Complete by:  As directed    Call MD for:  severe uncontrolled pain    Complete by:  As directed    Call MD for:  temperature >100.4    Complete by:  As directed    Discharge instructions    Complete by:  As directed    Please take your medications as prescribed. Eat soft diet for the next 2 days and then advance to regular.  You were cared for by a hospitalist during your hospital stay. If you have any questions about your discharge medications or the care you received while you were in the hospital after you are discharged, you can call the unit and asked to speak with the hospitalist on call if the hospitalist that took care of you is not available. Once you are discharged, your primary care physician will handle any further medical issues. Please note that NO REFILLS for any discharge medications will be authorized once you are discharged, as it is imperative that you return to your primary care physician (or establish a relationship with a primary care physician if you  do not have one) for your aftercare needs so that they can reassess your need for medications and monitor your lab values. If you do not have a primary care physician, you can call [phone] for a physician referral.   Increase activity slowly    Complete by:  As directed       ALLERGIES: No Known Allergies   Discharge Medication List as of 03/05/2017 12:40 PM    START taking these medications   Details  vancomycin (VANCOCIN) 125 MG capsule Take 1 capsule (125 mg total) by mouth 4 (four) times daily., Starting Sat 03/05/2017, Until Sun 03/13/2017, Print      CONTINUE these medications which have NOT CHANGED  Details  acetaminophen (TYLENOL) 500 MG tablet Take 500 mg by mouth every 6 (six) hours as needed for moderate pain or headache., Historical Med    ibuprofen (ADVIL,MOTRIN) 800 MG tablet Take 1 tablet (800 mg total) by mouth 3 (three) times daily., Starting Sat 01/29/2017, Print      STOP taking these medications     clindamycin (CLEOCIN) 150 MG capsule      famotidine (PEPCID) 20 MG tablet      HYDROcodone-acetaminophen (NORCO) 5-325 MG tablet      HYDROcodone-acetaminophen (NORCO/VICODIN) 5-325 MG per tablet      hydrOXYzine (ATARAX/VISTARIL) 25 MG tablet      metroNIDAZOLE (FLAGYL) 500 MG tablet      naproxen (NAPROSYN) 500 MG tablet      predniSONE (DELTASONE) 20 MG tablet      promethazine (PHENERGAN) 25 MG tablet          Follow-up Information    [facility] Follow up.   Why:  please call to arrange appointment Contact information: [address] [place] [address] [phone]          TOTAL DISCHARGE TIME: 35 mins  [facility]  [facility] Pager [phone]  03/05/2017, 1:52 PM

## 2017-03-22 ENCOUNTER — Ambulatory Visit (INDEPENDENT_AMBULATORY_CARE_PROVIDER_SITE_OTHER): Payer: Self-pay | Admitting: Family Medicine

## 2017-03-22 ENCOUNTER — Encounter: Payer: Self-pay | Admitting: Family Medicine

## 2017-03-22 VITALS — BP 117/74 | HR 89 | Temp 98.7°F | Resp 14 | Ht 65.0 in | Wt 183.0 lb

## 2017-03-22 DIAGNOSIS — N949 Unspecified condition associated with female genital organs and menstrual cycle: Secondary | ICD-10-CM

## 2017-03-22 DIAGNOSIS — R635 Abnormal weight gain: Secondary | ICD-10-CM

## 2017-03-22 LAB — POCT URINALYSIS DIP (DEVICE)
Bilirubin Urine: NEGATIVE
Glucose, UA: NEGATIVE mg/dL
HGB URINE DIPSTICK: NEGATIVE
Ketones, ur: NEGATIVE mg/dL
Leukocytes, UA: NEGATIVE
NITRITE: NEGATIVE
PH: 6.5 (ref 5.0–8.0)
PROTEIN: NEGATIVE mg/dL
Specific Gravity, Urine: 1.02 (ref 1.005–1.030)
UROBILINOGEN UA: 0.2 mg/dL (ref 0.0–1.0)

## 2017-03-22 LAB — TSH: TSH: 1.65 mIU/L

## 2017-03-22 LAB — POCT GLYCOSYLATED HEMOGLOBIN (HGB A1C): HEMOGLOBIN A1C: 4.9

## 2017-03-22 NOTE — Progress Notes (Signed)
Subjective:    Patient ID: Kimberly Yoder, female    DOB: [birth year], 31 y.o.   MRN: [id number]  HPI Kimberly Yoder, a 31 year old female presents to establish care. She was recently admitted to inpatient services for Clostridium Difficile. She says that she is no longer having nausea or diarrhea.   She She says that she was a patient of Dr. Alveta Heimlich, but has been lost to follow up due to insurance constraints. Patient is complaining of a genital lesion. She says that the lesion has been present off and on over the past several months. She is not sexually active at present. She denies vaginal discharge, vaginal itching, or burning.   Patient says that she feels well. She is up to date with pap smear. Last pap smear was in 2016 at the [facility]. She is also up to date with vaccinations.   History reviewed. No pertinent past medical history.  Social History   Social History  . Marital status: Single    Spouse name: N/A  . Number of children: N/A  . Years of education: N/A   Occupational History  . Not on file.   Social History Main Topics  . Smoking status: Former Games developer  . Smokeless tobacco: Never Used  . Alcohol use Yes     Comment: occ  . Drug use: No  . Sexual activity: Yes    Birth control/ protection: None   Other Topics Concern  . Not on file   Social History Narrative  . No narrative on file   There is no immunization history on file for this patient.  Review of Systems  Constitutional: Negative.   HENT: Negative.   Eyes: Negative.   Respiratory: Negative.   Cardiovascular: Negative.  Negative for chest pain, palpitations and leg swelling.  Gastrointestinal: Negative.   Endocrine: Negative.   Genitourinary: Negative.   Musculoskeletal: Negative.   Skin: Negative.  Negative for color change, pallor, rash and wound.  Neurological: Negative.   Hematological: Negative.   Psychiatric/Behavioral: Negative.         Objective:     Physical Exam  Constitutional: She is oriented to person, place, and time. She appears well-developed and well-nourished.  HENT:  Head: Normocephalic and atraumatic.  Right Ear: External ear normal.  Left Ear: External ear normal.  Mouth/Throat: Oropharynx is clear and moist.  Eyes: Conjunctivae are normal. Pupils are equal, round, and reactive to light.  Neck: Normal range of motion. Neck supple.  Cardiovascular: Normal rate, regular rhythm, normal heart sounds and intact distal pulses.   Pulmonary/Chest: Effort normal and breath sounds normal.  Abdominal: Soft. Bowel sounds are normal.  Genitourinary:    There is lesion on the left labia.  Musculoskeletal: Normal range of motion.  Neurological: She is alert and oriented to person, place, and time. She has normal reflexes.      BP 117/74 (BP Location: Right Arm, Patient Position: Sitting, Cuff Size: Normal)   Pulse 89   Temp 98.7 F (37.1 C) (Oral)   Resp 14   Ht  (1.651 m)   Wt 183 lb (83 kg)   LMP 02/25/2017   SpO2 100%   BMI 30.45 kg/m  Assessment & Plan:  1. Genital lesion, female Will follow up by phone with laboratory results. Recommend barrier protection with sexual intercourse.  - HSV(herpes simplex vrs) 1+2 ab-IgG  2. Weight gain Recommend a lowfat, low carbohydrate diet divided over 5-6 small meals, increase  water intake to 6-8 glasses, and 150 minutes per week of cardiovascular exercise.   - TSH - HgB A1c   Preventative maintenance: Recommend repeating pap smear in 1 year Review vaccinations   RTC: 6 months for a complete physical exam  Nolon Nations  MSN, FNP-C [facility] [address] [place], [place] [address] [phone]

## 2017-03-22 NOTE — Patient Instructions (Addendum)
Genital lesion: Will call with laboratory results  Weight gain: Will test thyroid and screen for type 2 diabetes mellitus Calorie Counting for Weight Loss Calories are units of energy. Your body needs a certain amount of calories from food to keep you going throughout the day. When you eat more calories than your body needs, your body stores the extra calories as fat. When you eat fewer calories than your body needs, your body burns fat to get the energy it needs. Calorie counting means keeping track of how many calories you eat and drink each day. Calorie counting can be helpful if you need to lose weight. If you make sure to eat fewer calories than your body needs, you should lose weight. Ask your health care provider what a healthy weight is for you. For calorie counting to work, you will need to eat the right number of calories in a day in order to lose a healthy amount of weight per week. A dietitian can help you determine how many calories you need in a day and will give you suggestions on how to reach your calorie goal.  A healthy amount of weight to lose per week is usually 1-2 lb (0.5-0.9 kg). This usually means that your daily calorie intake should be reduced by 500-750 calories.  Eating 1,200 - 1,500 calories per day can help most women lose weight.  Eating 1,500 - 1,800 calories per day can help most men lose weight. What is my plan? My goal is to have __________ calories per day. If I have this many calories per day, I should lose around __________ pounds per week. What do I need to know about calorie counting? In order to meet your daily calorie goal, you will need to:  Find out how many calories are in each food you would like to eat. Try to do this before you eat.  Decide how much of the food you plan to eat.  Write down what you ate and how many calories it had. Doing this is called keeping a food log. To successfully lose weight, it is important to balance calorie counting  with a healthy lifestyle that includes regular activity. Aim for 150 minutes of moderate exercise (such as walking) or 75 minutes of vigorous exercise (such as running) each week. Where do I find calorie information?   The number of calories in a food can be found on a Nutrition Facts label. If a food does not have a Nutrition Facts label, try to look up the calories online or ask your dietitian for help. Remember that calories are listed per serving. If you choose to have more than one serving of a food, you will have to multiply the calories per serving by the amount of servings you plan to eat. For example, the label on a package of bread might say that a serving size is 1 slice and that there are 90 calories in a serving. If you eat 1 slice, you will have eaten 90 calories. If you eat 2 slices, you will have eaten 180 calories. How do I keep a food log? Immediately after each meal, record the following information in your food log:  What you ate. Don't forget to include toppings, sauces, and other extras on the food.  How much you ate. This can be measured in cups, ounces, or number of items.  How many calories each food and drink had.  The total number of calories in the meal. Keep your food log near  you, such as in a small notebook in your pocket, or use a mobile app or website. Some programs will calculate calories for you and show you how many calories you have left for the day to meet your goal. What are some calorie counting tips?  Use your calories on foods and drinks that will fill you up and not leave you hungry:  Some examples of foods that fill you up are nuts and nut butters, vegetables, lean proteins, and high-fiber foods like whole grains. High-fiber foods are foods with more than 5 g fiber per serving.  Drinks such as sodas, specialty coffee drinks, alcohol, and juices have a lot of calories, yet do not fill you up.  Eat nutritious foods and avoid empty calories. Empty  calories are calories you get from foods or beverages that do not have many vitamins or protein, such as candy, sweets, and soda. It is better to have a nutritious high-calorie food (such as an avocado) than a food with few nutrients (such as a bag of chips).  Know how many calories are in the foods you eat most often. This will help you calculate calorie counts faster.  Pay attention to calories in drinks. Low-calorie drinks include water and unsweetened drinks.  Pay attention to nutrition labels for "low fat" or "fat free" foods. These foods sometimes have the same amount of calories or more calories than the full fat versions. They also often have added sugar, starch, or salt, to make up for flavor that was removed with the fat.  Find a way of tracking calories that works for you. Get creative. Try different apps or programs if writing down calories does not work for you. What are some portion control tips?  Know how many calories are in a serving. This will help you know how many servings of a certain food you can have.  Use a measuring cup to measure serving sizes. You could also try weighing out portions on a kitchen scale. With time, you will be able to estimate serving sizes for some foods.  Take some time to put servings of different foods on your favorite plates, bowls, and cups so you know what a serving looks like.  Try not to eat straight from a bag or box. Doing this can lead to overeating. Put the amount you would like to eat in a cup or on a plate to make sure you are eating the right portion.  Use smaller plates, glasses, and bowls to prevent overeating.  Try not to multitask (for example, watch TV or use your computer) while eating. If it is time to eat, sit down at a table and enjoy your food. This will help you to know when you are full. It will also help you to be aware of what you are eating and how much you are eating. What are tips for following this plan? Reading food  labels   Check the calorie count compared to the serving size. The serving size may be smaller than what you are used to eating.  Check the source of the calories. Make sure the food you are eating is high in vitamins and protein and low in saturated and trans fats. Shopping   Read nutrition labels while you shop. This will help you make healthy decisions before you decide to purchase your food.  Make a grocery list and stick to it. Cooking   Try to cook your favorite foods in a healthier way. For example, try baking  instead of frying.  Use low-fat dairy products. Meal planning   Use more fruits and vegetables. Half of your plate should be fruits and vegetables.  Include lean proteins like poultry and fish. How do I count calories when eating out?  Ask for smaller portion sizes.  Consider sharing an entree and sides instead of getting your own entree.  If you get your own entree, eat only half. Ask for a box at the beginning of your meal and put the rest of your entree in it so you are not tempted to eat it.  If calories are listed on the menu, choose the lower calorie options.  Choose dishes that include vegetables, fruits, whole grains, low-fat dairy products, and lean protein.  Choose items that are boiled, broiled, grilled, or steamed. Stay away from items that are buttered, battered, fried, or served with cream sauce. Items labeled "crispy" are usually fried, unless stated otherwise.  Choose water, low-fat milk, unsweetened iced tea, or other drinks without added sugar. If you want an alcoholic beverage, choose a lower calorie option such as a glass of wine or light beer.  Ask for dressings, sauces, and syrups on the side. These are usually high in calories, so you should limit the amount you eat.  If you want a salad, choose a garden salad and ask for grilled meats. Avoid extra toppings like bacon, cheese, or fried items. Ask for the dressing on the side, or ask for olive  oil and vinegar or lemon to use as dressing.  Estimate how many servings of a food you are given. For example, a serving of cooked rice is  cup or about the size of half a baseball. Knowing serving sizes will help you be aware of how much food you are eating at restaurants. The list below tells you how big or small some common portion sizes are based on everyday objects:  1 oz-4 stacked dice.  3 oz-1 deck of cards.  1 tsp-1 die.  1 Tbsp- a ping-pong ball.  2 Tbsp-1 ping-pong ball.   cup- baseball.  1 cup-1 baseball. Summary  Calorie counting means keeping track of how many calories you eat and drink each day. If you eat fewer calories than your body needs, you should lose weight.  A healthy amount of weight to lose per week is usually 1-2 lb (0.5-0.9 kg). This usually means reducing your daily calorie intake by 500-750 calories.  The number of calories in a food can be found on a Nutrition Facts label. If a food does not have a Nutrition Facts label, try to look up the calories online or ask your dietitian for help.  Use your calories on foods and drinks that will fill you up, and not on foods and drinks that will leave you hungry.  Use smaller plates, glasses, and bowls to prevent overeating. This information is not intended to replace advice given to you by your health care provider. Make sure you discuss any questions you have with your health care provider. Document Released: 11/15/2005 Document Revised: 10/15/2016 Document Reviewed: 10/15/2016 Elsevier Interactive Patient Education  2017 [employer]  Heart-Healthy Eating Plan Heart-healthy meal planning includes:  Limiting unhealthy fats.  Increasing healthy fats.  Making other small dietary changes. You may need to talk with your doctor or a diet specialist (dietitian) to create an eating plan that is right for you. What types of fat should I choose?  Choose healthy fats. These include olive oil and canola oil,  flaxseeds, walnuts, almonds, and seeds.  Eat more omega-3 fats. These include salmon, mackerel, sardines, tuna, flaxseed oil, and ground flaxseeds. Try to eat fish at least twice each week.  Limit saturated fats.  Saturated fats are often found in animal products, such as meats, butter, and cream.  Plant sources of saturated fats include palm oil, palm kernel oil, and coconut oil.  Avoid foods with partially hydrogenated oils in them. These include stick margarine, some tub margarines, cookies, crackers, and other baked goods. These contain trans fats. What general guidelines do I need to follow?  Check food labels carefully. Identify foods with trans fats or high amounts of saturated fat.  Fill one half of your plate with vegetables and green salads. Eat 4-5 servings of vegetables per day. A serving of vegetables is:  1 cup of raw leafy vegetables.   cup of raw or cooked cut-up vegetables.   cup of vegetable juice.  Fill one fourth of your plate with whole grains. Look for the word "whole" as the first word in the ingredient list.  Fill one fourth of your plate with lean protein foods.  Eat 4-5 servings of fruit per day. A serving of fruit is:  One medium whole fruit.   cup of dried fruit.   cup of fresh, frozen, or canned fruit.   cup of 100% fruit juice.  Eat more foods that contain soluble fiber. These include apples, broccoli, carrots, beans, peas, and barley. Try to get 20-30 g of fiber per day.  Eat more home-cooked food. Eat less restaurant, buffet, and fast food.  Limit or avoid alcohol.  Limit foods high in starch and sugar.  Avoid fried foods.  Avoid frying your food. Try baking, boiling, grilling, or broiling it instead. You can also reduce fat by:  Removing the skin from poultry.  Removing all visible fats from meats.  Skimming the fat off of stews, soups, and gravies before serving them.  Steaming vegetables in water or broth.  Lose weight  if you are overweight.  Eat 4-5 servings of nuts, legumes, and seeds per week:  One serving of dried beans or legumes equals  cup after being cooked.  One serving of nuts equals 1 ounces.  One serving of seeds equals  ounce or one tablespoon.  You may need to keep track of how much salt or sodium you eat. This is especially true if you have high blood pressure. Talk with your doctor or dietitian to get more information. What foods can I eat? Grains  Breads, including [place], white, pita, wheat, raisin, rye, oatmeal, and [place]. Tortillas that are neither fried nor made with lard or trans fat. Low-fat rolls, including hotdog and hamburger buns and English muffins. Biscuits. Muffins. Waffles. Pancakes. Light popcorn. Whole-grain cereals. Flatbread. Melba toast. Pretzels. Breadsticks. Rusks. Low-fat snacks. Low-fat crackers, including oyster, saltine, matzo, graham, animal, and rye. Rice and pasta, including brown rice and pastas that are made with whole wheat. Vegetables  All vegetables. Fruits  All fruits, but limit coconut. Meats and Other Protein Sources  Lean, well-trimmed beef, veal, pork, and lamb. Chicken and [place] without skin. All fish and shellfish. Wild duck, rabbit, pheasant, and venison. Egg whites or low-cholesterol egg substitutes. Dried beans, peas, lentils, and tofu. Seeds and most nuts. Dairy  Low-fat or nonfat cheeses, including ricotta, string, and mozzarella. Skim or 1% milk that is liquid, powdered, or evaporated. Buttermilk that is made with low-fat milk. Nonfat or low-fat yogurt. Beverages  Mineral water. Diet  carbonated beverages. Sweets and Desserts  Sherbets and fruit ices. Honey, jam, marmalade, jelly, and syrups. Meringues and gelatins. Pure sugar candy, such as hard candy, jelly beans, gumdrops, mints, marshmallows, and small amounts of dark chocolate. [employer]. Eat all sweets and desserts in moderation. Fats and Oils  Nonhydrogenated (trans-free)  margarines. Vegetable oils, including soybean, sesame, sunflower, olive, peanut, safflower, corn, canola, and cottonseed. Salad dressings or mayonnaise made with a vegetable oil. Limit added fats and oils that you use for cooking, baking, salads, and as spreads. Other  Cocoa powder. Coffee and tea. All seasonings and condiments. The items listed above may not be a complete list of recommended foods or beverages. Contact your dietitian for more options.  What foods are not recommended? Grains  Breads that are made with saturated or trans fats, oils, or whole milk. Croissants. Butter rolls. Cheese breads. Sweet rolls. Donuts. Buttered popcorn. Chow mein noodles. High-fat crackers, such as cheese or butter crackers. Meats and Other Protein Sources  Fatty meats, such as hotdogs, short ribs, sausage, spareribs, bacon, rib eye roast or steak, and mutton. High-fat deli meats, such as salami and bologna. Caviar. Domestic duck and goose. Organ meats, such as kidney, liver, sweetbreads, and heart. Dairy  Cream, sour cream, cream cheese, and creamed cottage cheese. Whole-milk cheeses, including blue (bleu), [address], [place], [place], [address], [place], [address], cheddar, [place], and [place]. Whole or 2% milk that is liquid, evaporated, or condensed. Whole buttermilk. Cream sauce or high-fat cheese sauce. Yogurt that is made from whole milk. Beverages  Regular sodas and juice drinks with added sugar. Sweets and Desserts  Frosting. Pudding. Cookies. Cakes other than angel food cake. Candy that has milk chocolate or white chocolate, hydrogenated fat, butter, coconut, or unknown ingredients. Buttered syrups. Full-fat ice cream or ice cream drinks. Fats and Oils  Gravy that has suet, meat fat, or shortening. Cocoa butter, hydrogenated oils, palm oil, coconut oil, palm kernel oil. These can often be found in baked products, candy, fried foods, nondairy creamers, and whipped toppings. Solid fats and shortenings,  including bacon fat, salt pork, lard, and butter. Nondairy cream substitutes, such as coffee creamers and sour cream substitutes. Salad dressings that are made of unknown oils, cheese, or sour cream. The items listed above may not be a complete list of foods and beverages to avoid. Contact your dietitian for more information.  This information is not intended to replace advice given to you by your health care provider. Make sure you discuss any questions you have with your health care provider. Document Released: 05/16/2012 Document Revised: 04/22/2016 Document Reviewed: 05/09/2014 Elsevier Interactive Patient Education  2017 [employer].

## 2017-03-23 DIAGNOSIS — R635 Abnormal weight gain: Secondary | ICD-10-CM | POA: Insufficient documentation

## 2017-03-23 LAB — HSV(HERPES SIMPLEX VRS) I + II AB-IGG: HSV 1 Glycoprotein G Ab, IgG: 42.4 Index — ABNORMAL HIGH (ref <0.90)

## 2017-05-02 ENCOUNTER — Telehealth: Payer: Self-pay

## 2017-05-02 NOTE — Telephone Encounter (Signed)
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 Recommend applying OTC Abreva with cold sores.    Thanks

## 2017-05-02 NOTE — Telephone Encounter (Signed)
Called patient and advised of positive for Type 1 herpes. Advised patient to use otc Abreva for any future out brake she may have. Patient verbalized understanding. Thanks!

## 2018-04-20 ENCOUNTER — Emergency Department (HOSPITAL_COMMUNITY)
Admission: EM | Admit: 2018-04-20 | Discharge: 2018-04-20 | Disposition: A | Discharge status: Home / Self Care | Payer: Self-pay | Attending: Emergency Medicine | Admitting: Emergency Medicine

## 2018-04-20 ENCOUNTER — Encounter (HOSPITAL_COMMUNITY): Payer: Self-pay | Admitting: Emergency Medicine

## 2018-04-20 DIAGNOSIS — H9201 Otalgia, right ear: Secondary | ICD-10-CM | POA: Insufficient documentation

## 2018-04-20 DIAGNOSIS — K1379 Other lesions of oral mucosa: Secondary | ICD-10-CM | POA: Insufficient documentation

## 2018-04-20 DIAGNOSIS — K029 Dental caries, unspecified: Secondary | ICD-10-CM | POA: Insufficient documentation

## 2018-04-20 DIAGNOSIS — Z87891 Personal history of nicotine dependence: Secondary | ICD-10-CM | POA: Insufficient documentation

## 2018-04-20 MED ORDER — OXYCODONE HCL 5 MG PO TABS
5.0000 mg | ORAL_TABLET | Freq: Once | ORAL | Status: AC
  Administered 2018-04-20: 5 mg via ORAL
  Filled 2018-04-20: qty 1

## 2018-04-20 MED ORDER — OXYCODONE HCL 5 MG PO TABS: 5.0000 mg | ORAL_TABLET | Freq: Four times a day (QID) | ORAL | 0 refills | Status: AC | PRN

## 2018-04-20 MED ORDER — PENICILLIN V POTASSIUM 500 MG PO TABS: 500.0000 mg | ORAL_TABLET | Freq: Three times a day (TID) | ORAL | 0 refills | Status: AC

## 2018-04-20 NOTE — ED Notes (Signed)
Bed: WTR8 Expected date:  Expected time:  Means of arrival:  Comments: 

## 2018-04-20 NOTE — ED Triage Notes (Signed)
Pt c/o right ear pain for couple days. Went to [employer] around 2am this morning so bought some ear drops OTC and took 2 tylenol  and used sweet oil.

## 2018-04-20 NOTE — ED Provider Notes (Signed)
[facility]-EMERGENCY DEPT Provider Note  CSN: [id number] Arrival date & time: 04/20/18  1131   History   Chief Complaint Chief Complaint  Patient presents with  . Otalgia    HPI Kimberly Yoder is a 32 y.o. female with a medical history of C. diff who presented to the ED for right ear pain x 1 day. Patient describes the pain as aching and constant. It is worse when she lays down at night. Denies recent sick contacts, illnesses, swimming or prolonged exposure to water or trauma.  Associated symptoms: mouth pain, facial pain and facial swelling. Denies fever, sore throat, difficulty eating.  Patient has tried ibuprofen  x1, tylenol  x2 and OTC ear drops  prior to coming to the ED without any relief.  History reviewed. No pertinent past medical history.  Patient Active Problem List   Diagnosis Date Noted  . Weight gain 03/23/2017  . C. difficile colitis 03/03/2017  . Colitis 03/02/2017    History reviewed. No pertinent surgical history.   OB History   None      Home Medications    Prior to Admission medications   Medication Sig Start Date End Date Taking? Authorizing Provider  acetaminophen (TYLENOL) 500 MG tablet Take 500 mg by mouth every 6 (six) hours as needed for moderate pain or headache.   Yes [provider]  oxyCODONE (ROXICODONE) 5 MG immediate release tablet Take 1 tablet (5 mg total) by mouth every 6 (six) hours as needed for up to 3 days for severe pain. 04/20/18 04/23/18  Mortis, Jerrel Ivory I, PA-C  penicillin v potassium (VEETID) 500 MG tablet Take 1 tablet (500 mg total) by mouth 3 (three) times daily for 5 days. 04/20/18 04/25/18  Mortis, Sharyon Medicus, PA-C    Family History Family History  Problem Relation Age of Onset  . CAD Other     Social History Social History   Tobacco Use  . Smoking status: Former Games developer  . Smokeless tobacco: Never Used  Substance Use Topics  . Alcohol use: Yes    Comment: occ  . Drug  use: No     Allergies   Patient has no known allergies.   Review of Systems Review of Systems  Constitutional: Negative.  Negative for activity change, appetite change, fatigue and fever.  HENT: Positive for dental problem and ear pain. Negative for ear discharge, postnasal drip, rhinorrhea, sore throat and trouble swallowing.   Eyes: Negative.   Skin: Negative.   Neurological: Negative.  Negative for headaches.  Hematological: Negative.      Physical Exam Updated Vital Signs BP 139/85 (BP Location: Right Arm)   Pulse 82   Temp 99.1 F (37.3 C) (Oral)   Resp 17   LMP 03/18/2018   SpO2 99%   Physical Exam  Constitutional: She appears well-developed and well-nourished.  HENT:  Head: Normocephalic and atraumatic.  Right Ear: Hearing, tympanic membrane, external ear and ear canal normal. There is tenderness. No drainage or swelling. No mastoid tenderness. No middle ear effusion. No decreased hearing is noted.  Left Ear: Hearing, tympanic membrane, external ear and ear canal normal.  Nose: Nose normal. Right sinus exhibits no maxillary sinus tenderness and no frontal sinus tenderness. Left sinus exhibits no maxillary sinus tenderness and no frontal sinus tenderness.  Mouth/Throat: Uvula is midline, oropharynx is clear and moist and mucous membranes are normal. Dental caries present. No dental abscesses or uvula swelling. No oropharyngeal exudate.  Right tragus tender to palpation. Decay  noted at gingiva especially in back upper and lower molars on right side. Teeth non-tender to direct palpation with tongue depressor.   Eyes: Pupils are equal, round, and reactive to light. Conjunctivae, EOM and lids are normal.  Neck: Trachea normal, normal range of motion, full passive range of motion without pain and phonation normal. Neck supple. No tracheal deviation present.  Lymphadenopathy:       Head (right side): No submental, no submandibular, no tonsillar, no preauricular, no posterior  auricular and no occipital adenopathy present.       Head (left side): No submental, no submandibular, no tonsillar, no preauricular, no posterior auricular and no occipital adenopathy present.    She has no cervical adenopathy.  Nursing note and vitals reviewed.    ED Treatments / Results  Labs (all labs ordered are listed, but only abnormal results are displayed) Labs Reviewed - No data to display  EKG None  Radiology No results found.  Procedures Procedures (including critical care time)  Medications Ordered in ED Medications  oxyCODONE (Oxy IR/ROXICODONE) immediate release tablet 5 mg (has no administration in time range)     Initial Impression / Assessment and Plan / ED Course  Triage vital signs and the nursing notes have been reviewed.  Pertinent labs & imaging results that were available during care of the patient were reviewed and considered in medical decision making (see chart for details).   Patient presents in no acute distress. While she complains primarily of otalgia, physical exam findings of ear were unremarkable and most of pain appears to be referred pain coming from mouth and dentition. Patient does not complain of systemic s/s that would suggest infection. No signs of infection in mouth seen on exam. No acute bleeding, signs of abscess, discharge or odor. Patient is able to eat and drink without issue. Given severity of pain, facial swelling and decay noted at gingiva, any infection present may be deep to visualization. Will refer patient to dentist, provide short-term pain medication and prophylactic antibiotic. No indications of labs of imaging at this time.  Final Clinical Impressions(s) / ED Diagnoses  1. Mouth Pain. Etiology unclear at this moment. No obvious signs of infection noted, but possible gingiva infection given the degree of decay seen on mouth exam. Prophylactic antibiotic prescribed PCN  TID x5 days in addition to Roxicodone  x 3 days  to assist in pain relief.  Dispo: Home. After thorough clinical evaluation, this patient is determined to be medically stable and can be safely discharged with the previously mentioned treatment and/or outpatient follow-up/referral(s). At this time, there are no other apparent medical conditions that require further screening, evaluation or treatment.   Final diagnoses:  Mouth pain    ED Discharge Orders        Ordered    penicillin v potassium (VEETID) 500 MG tablet  3 times daily     04/20/18 1223    oxyCODONE (ROXICODONE) 5 MG immediate release tablet  Every 6 hours PRN     04/20/18 1223        Dagoberto Ligas I, PA-C 04/20/18 1240    Long, Arlyss Repress, MD 04/21/18 [phone]

## 2018-04-20 NOTE — Discharge Instructions (Signed)
Continue to take Ibuprofen  for breakthrough pain.  Follow-up with dentist ASAP for further evaluation.

## 2018-08-16 ENCOUNTER — Encounter: Payer: Self-pay | Admitting: Family Medicine

## 2018-08-16 ENCOUNTER — Ambulatory Visit (INDEPENDENT_AMBULATORY_CARE_PROVIDER_SITE_OTHER): Payer: Self-pay | Admitting: Family Medicine

## 2018-08-16 VITALS — BP 127/78 | HR 84 | Temp 99.2°F | Resp 16 | Ht 65.0 in | Wt 179.0 lb

## 2018-08-16 DIAGNOSIS — N949 Unspecified condition associated with female genital organs and menstrual cycle: Secondary | ICD-10-CM

## 2018-08-16 DIAGNOSIS — N898 Other specified noninflammatory disorders of vagina: Secondary | ICD-10-CM

## 2018-08-16 MED ORDER — VALACYCLOVIR HCL 1 G PO TABS: 1000.0000 mg | ORAL_TABLET | Freq: Two times a day (BID) | ORAL | 2 refills | Status: AC

## 2018-08-16 MED ORDER — VALACYCLOVIR HCL 500 MG PO TABS: 500.0000 mg | ORAL_TABLET | Freq: Every day | ORAL | 5 refills | Status: DC

## 2018-08-16 MED ORDER — FLUCONAZOLE 150 MG PO TABS: 150.0000 mg | ORAL_TABLET | Freq: Once | ORAL | 0 refills | Status: AC

## 2018-08-16 NOTE — Progress Notes (Signed)
  [facility]   Progress Note: General Provider: Mike GipAndre Vennie Waymire, FNP  SUBJECTIVE:   Kimberly Yoder is a 32 y.o. female who  has no past medical history on file.. Patient presents today for Vaginal Pain (patient has herpes and wants to start medication. ) and Vaginitis Patient states that she has genital herpes. Presents with an outbreak today.  Review of Systems  Genitourinary:       Genital itching, pain and herpes outbreak.   All other systems reviewed and are negative.    OBJECTIVE: BP 127/78 (BP Location: Right Arm, Patient Position: Sitting, Cuff Size: Normal)   Pulse 84   Temp 99.2 F (37.3 C) (Oral)   Resp 16   Ht 5\' 5"  (1.651 m)   Wt 179 lb (81.2 kg)   LMP 08/14/2018   SpO2 99%   BMI 29.79 kg/m   Physical Exam  Constitutional: She is oriented to person, place, and time. She appears well-developed and well-nourished. No distress.  HENT:  Head: Normocephalic and atraumatic.  Genitourinary:  Genitourinary Comments: Patient refused due to menstruation.   Neurological: She is alert and oriented to person, place, and time.  Skin: Skin is warm and dry.  Psychiatric: She has a normal mood and affect. Her behavior is normal. Judgment and thought content normal.  Pt crying throughout the encounter.    Nursing note and vitals reviewed.   ASSESSMENT/PLAN:  1. 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 Patient declined examination today.  - Vaginitis/Vaginosis, DNA Probe - valACYclovir (VALTREX) 500 MG tablet; Take 1 tablet (500 mg total) by mouth daily.  Dispense: 30 tablet; Refill: 5 - valACYclovir (VALTREX) 1000 MG tablet; Take 1 tablet (1,000 mg total) by mouth 2 (two) times daily for 3 days.  Dispense: 6 tablet; Refill: 2 - CBC with Differential - Comprehensive metabolic panel  2. Vaginal itching Will treat based on symptoms. Vaginitis panel pending.  - fluconazole (DIFLUCAN) 150 MG tablet; Take 1 tablet (150  mg total) by mouth once for 1 dose. Take one pill by mouth now and repeat in 72 hours if symptoms continue  Dispense: 2 tablet; Refill: 0   We discussed treatment of current outbreak and daily suppressive therapy. Patient would like to do suppressive therapy. Baseline CMP ordered today. Will monitor q 6 months.        The patient was given clear instructions to go to ER or return to medical center if symptoms do not improve, worsen or new problems develop. The patient verbalized understanding and agreed with plan of care.   Ms. Kimberly Jacksonndr L. Riley Lamouglas, FNP-BC [facility] [address]  [place], [place] [id number] [phone]     This note has been created with Dragon speech recognition software and smart phrase technology. Any transcriptional errors are unintentional.

## 2018-08-16 NOTE — Patient Instructions (Addendum)
Start the valtrex 1 gram twice a day for the next 3 days. After that take 500 mg by mouth daily for prevention.    Genital Herpes Genital herpes is a common sexually transmitted infection (STI) that is caused by a virus. The virus spreads from person to person through sexual contact. Infection can cause itching, blisters, and sores around the genitals or rectum. Symptoms may last several days and then go away This is called an outbreak. However, the virus remains in your body, so you may have more outbreaks in the future. The time between outbreaks varies and can be months or years. Genital herpes affects men and women. It is particularly concerning for pregnant women because the virus can be passed to the baby during delivery and can cause serious problems. Genital herpes is also a concern for people who have a weak disease-fighting (immune) system. What are the causes? This condition is caused by the herpes simplex virus (HSV) type 1 or type 2. The virus may spread through:  Sexual contact with an infected person, including vaginal, anal, and oral sex.  Contact with fluid from a herpes sore.  The skin. This means that you can get herpes from an infected partner even if he or she does not have a visible sore or does not know that he or she is infected.  What increases the risk? You are more likely to develop this condition if:  You have sex with many partners.  You do not use latex condoms during sex.  What are the signs or symptoms? Most people do not have symptoms (asymptomatic) or have mild symptoms that may be mistaken for other skin problems. Symptoms may include:  Small red bumps near the genitals, rectum, or mouth. These bumps turn into blisters and then turn into sores.  Flu-like symptoms, including: ? Fever. ? Body aches. ? Swollen lymph nodes. ? Headache.  Painful urination.  Pain and itching in the genital area or rectal area.  Vaginal discharge.  Tingling or  shooting pain in the legs and buttocks.  Generally, symptoms are more severe and last longer during the first (primary) outbreak. Flu-like symptoms are also more common during the primary outbreak. How is this diagnosed? Genital herpes may be diagnosed based on:  A physical exam.  Your medical history.  Blood tests.  A test of a fluid sample (culture) from an open sore.  How is this treated? There is no cure for this condition, but treatment with antiviral medicines that are taken by mouth (orally) can do the following:  Speed up healing and relieve symptoms.  Help to reduce the spread of the virus to sexual partners.  Limit the chance of future outbreaks, or make future outbreaks shorter.  Lessen symptoms of future outbreaks.  Your health care provider may also recommend pain relief medicines, such as aspirin or ibuprofen. Follow these instructions at home: Sexual activity  Do not have sexual contact during active outbreaks.  Practice safe sex. Latex condoms and female condoms may help prevent the spread of the herpes virus. General instructions  Keep the affected areas dry and clean.  Take over-the-counter and prescription medicines only as told by your health care provider.  Avoid rubbing or touching blisters and sores. If you do touch blisters or sores: ? Wash your hands thoroughly with soap and water. ? Do not touch your eyes afterward.  To help relieve pain or itching, you may take the following actions as directed by your health care provider: ?  Apply a cold, wet cloth (cold compress) to affected areas 4-6 times a day. ? Apply a substance that protects your skin and reduces bleeding (astringent). ? Apply a gel that helps relieve pain around sores (lidocaine gel). ? Take a warm, shallow bath that cleans the genital area (sitz bath).  Keep all follow-up visits as told by your health care provider. This is important. How is this prevented?  Use condoms. Although  anyone can get genital herpes during sexual contact, even with the use of a condom, a condom can provide some protection.  Avoid having multiple sexual partners.  Talk with your sexual partner about any symptoms either of you may have. Also, talk with your partner about any history of STIs.  Get tested for STIs before you have sex. Ask your partner to do the same.  Do not have sexual contact if you have symptoms of genital herpes. Contact a health care provider if:  Your symptoms are not improving with medicine.  Your symptoms return.  You have new symptoms.  You have a fever.  You have abdominal pain.  You have redness, swelling, or pain in your eye.  You notice new sores on other parts of your body.  You are a woman and experience bleeding between menstrual periods.  You have had herpes and you become pregnant or plan to become pregnant. Summary  Genital herpes is a common sexually transmitted infection (STI) that is caused by the herpes simplex virus (HSV) type 1 or type 2.  These viruses are most often spread through sexual contact with an infected person.  You are more likely to develop this condition if you have sex with many partners or you have unprotected sex.  Most people do not have symptoms (asymptomatic) or have mild symptoms that may be mistaken for other skin problems. Symptoms occur as outbreaks that may happen months or years apart.  There is no cure for this condition, but treatment with oral antiviral medicines can reduce symptoms, reduce the chance of spreading the virus to a partner, prevent future outbreaks, or shorten future outbreaks. This information is not intended to replace advice given to you by your health care provider. Make sure you discuss any questions you have with your health care provider. Document Released: 11/12/2000 Document Revised: 10/15/2016 Document Reviewed: 10/15/2016 Elsevier Interactive Patient Education  [employer]2018 [employer]vier Inc.

## 2018-08-17 LAB — COMPREHENSIVE METABOLIC PANEL
ALT: 15 IU/L (ref 0–32)
AST: 19 IU/L (ref 0–40)
Albumin/Globulin Ratio: 1.9 (ref 1.2–2.2)
Albumin: 4.6 g/dL (ref 3.5–5.5)
Alkaline Phosphatase: 53 IU/L (ref 39–117)
BUN/Creatinine Ratio: 11 (ref 9–23)
BUN: 8 mg/dL (ref 6–20)
Bilirubin Total: 0.3 mg/dL (ref 0.0–1.2)
CO2: 24 mmol/L (ref 20–29)
Calcium: 9.4 mg/dL (ref 8.7–10.2)
Chloride: 103 mmol/L (ref 96–106)
Creatinine, Ser: 0.72 mg/dL (ref 0.57–1.00)
GFR calc Af Amer: 128 mL/min/{1.73_m2} (ref >59)
GFR calc non Af Amer: 111 mL/min/{1.73_m2} (ref >59)
Globulin, Total: 2.4 g/dL (ref 1.5–4.5)
Glucose: 86 mg/dL (ref 65–99)
Potassium: 4.5 mmol/L (ref 3.5–5.2)
Sodium: 141 mmol/L (ref 134–144)
Total Protein: 7 g/dL (ref 6.0–8.5)

## 2018-08-17 LAB — CBC WITH DIFFERENTIAL/PLATELET
Basophils Absolute: 0 10*3/uL (ref 0.0–0.2)
Basos: 1 %
EOS (ABSOLUTE): 0.1 10*3/uL (ref 0.0–0.4)
Eos: 2 %
Hematocrit: 38.3 % (ref 34.0–46.6)
Hemoglobin: 12.9 g/dL (ref 11.1–15.9)
Immature Grans (Abs): 0 10*3/uL (ref 0.0–0.1)
Immature Granulocytes: 0 %
Lymphocytes Absolute: 2.2 10*3/uL (ref 0.7–3.1)
Lymphs: 42 %
MCH: 27.2 pg (ref 26.6–33.0)
MCHC: 33.7 g/dL (ref 31.5–35.7)
MCV: 81 fL (ref 79–97)
Monocytes Absolute: 0.6 10*3/uL (ref 0.1–0.9)
Monocytes: 10 %
Neutrophils Absolute: 2.4 10*3/uL (ref 1.4–7.0)
Neutrophils: 45 %
Platelets: 344 10*3/uL (ref 150–450)
RBC: 4.74 x10E6/uL (ref 3.77–5.28)
RDW: 15.4 % (ref 12.3–15.4)
WBC: 5.3 10*3/uL (ref 3.4–10.8)

## 2018-08-18 LAB — VAGINITIS/VAGINOSIS, DNA PROBE
Candida Species: NEGATIVE
Gardnerella vaginalis: NEGATIVE
Trichomonas vaginosis: NEGATIVE

## 2018-08-31 IMAGING — CT CT ABD-PELV W/ CM
2 of 4 series · 15 of 46 positions shown, 17 images · IV contrast (ISOVUE)
Comparison: October 07, 2014

CLINICAL DATA: Generalized abdominal pain

EXAM:
CT ABDOMEN AND PELVIS WITH CONTRAST
TECHNIQUE: Multidetector CT imaging of the abdomen and pelvis was performed
using the standard protocol following bolus administration of
intravenous contrast. Oral contrast was also administered.
CONTRAST:  100mL ASA01M-HKK IOPAMIDOL (ASA01M-HKK) INJECTION 61%

[Series 2: abd/pel with · axial · 0.64mm/px · z∈[+1296,+1676]mm · 12 of 85 slices shown, 14 images]
[im 5/85  soft-tissue]
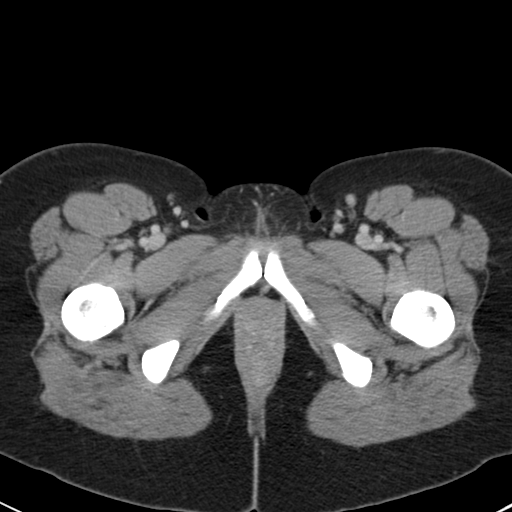
[im 5/85  bone]
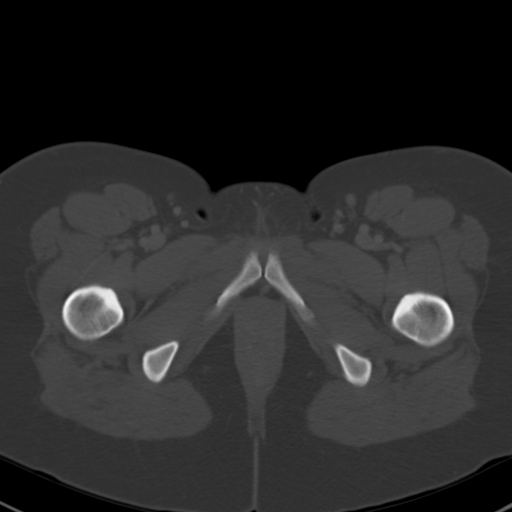
[im 13/85  soft-tissue]
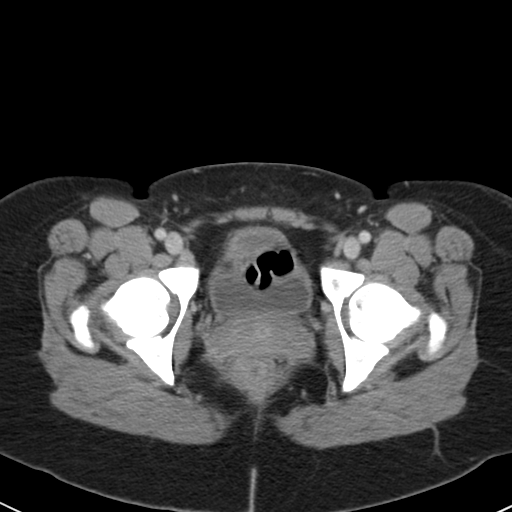
[im 21/85  soft-tissue]
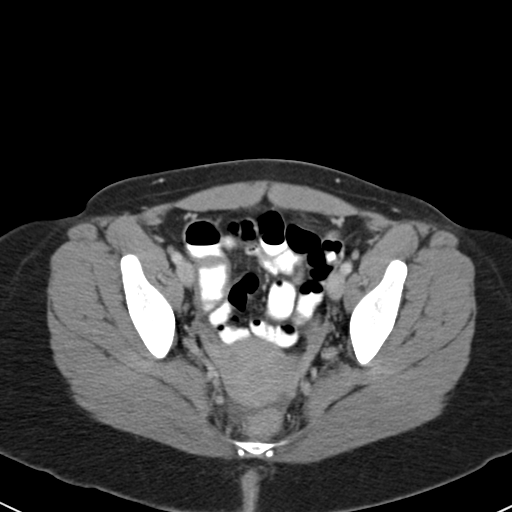
[im 25/85  soft-tissue]
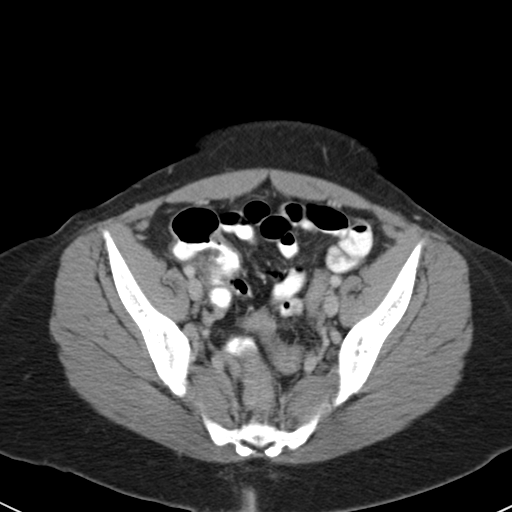
[im 33/85  soft-tissue]
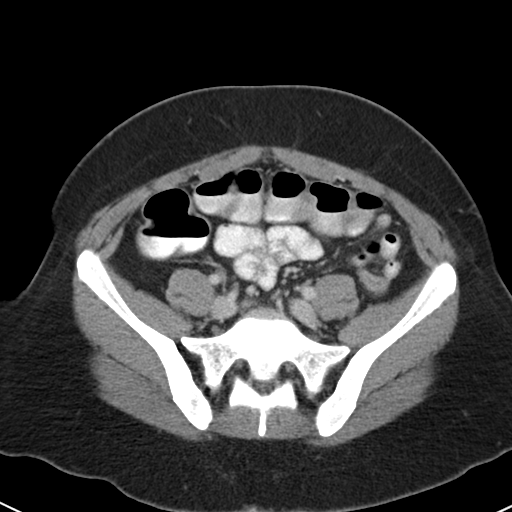
[im 41/85  soft-tissue]
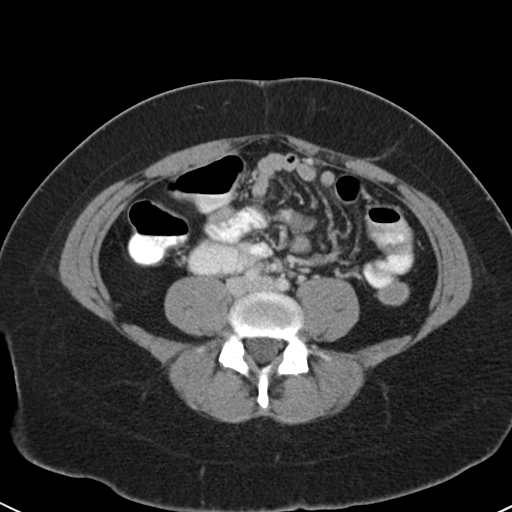
[im 45/85  soft-tissue]
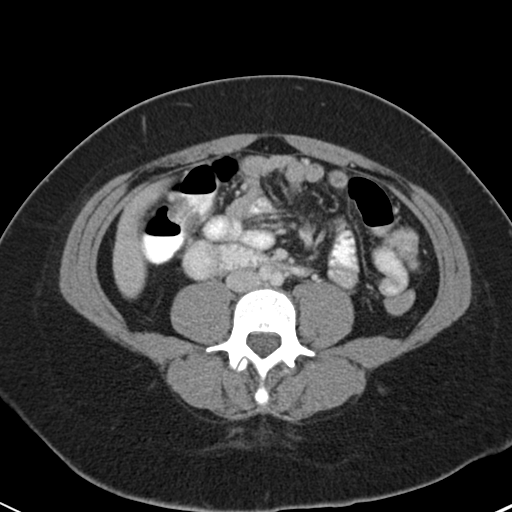
[im 53/85  soft-tissue]
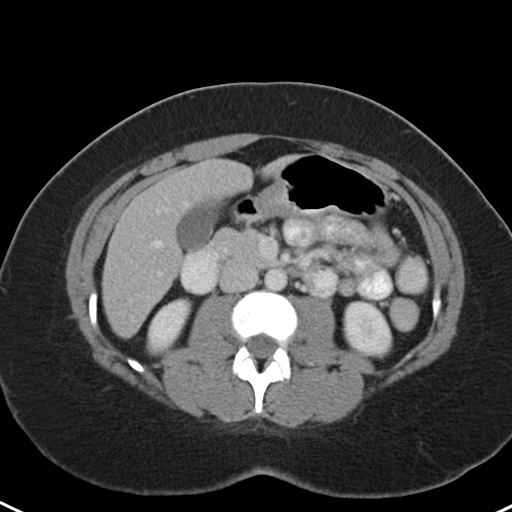
[im 61/85  soft-tissue]
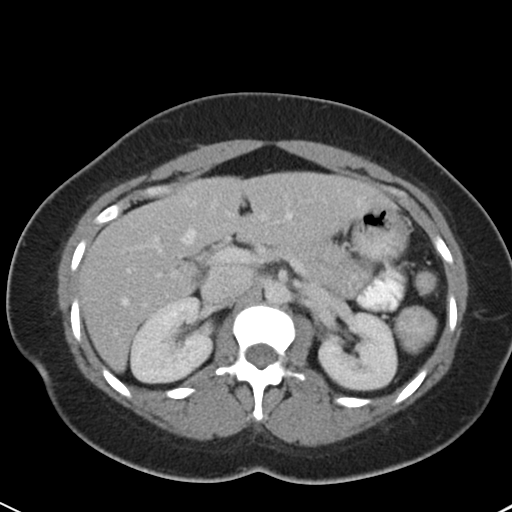
[im 61/85  bone]
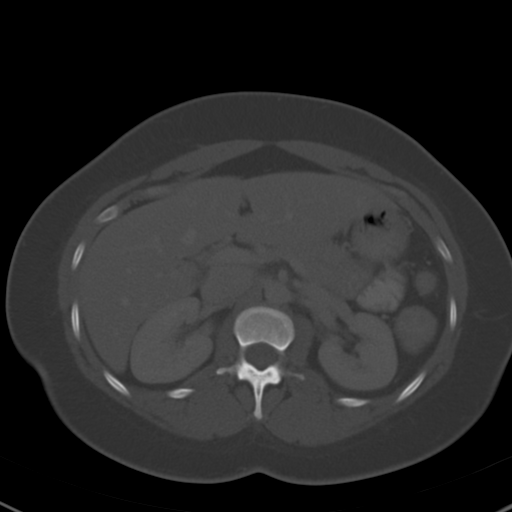
[im 65/85  soft-tissue]
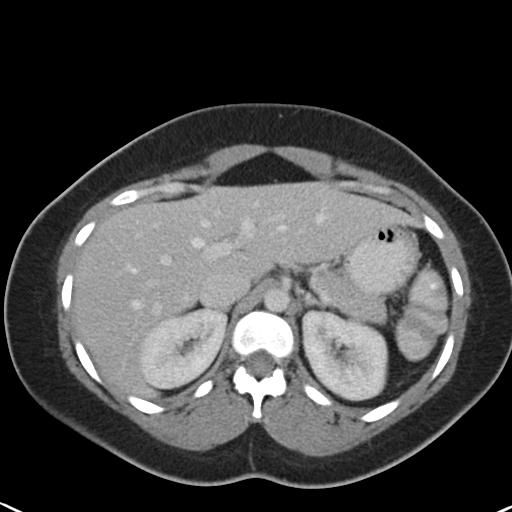
[im 73/85  soft-tissue]
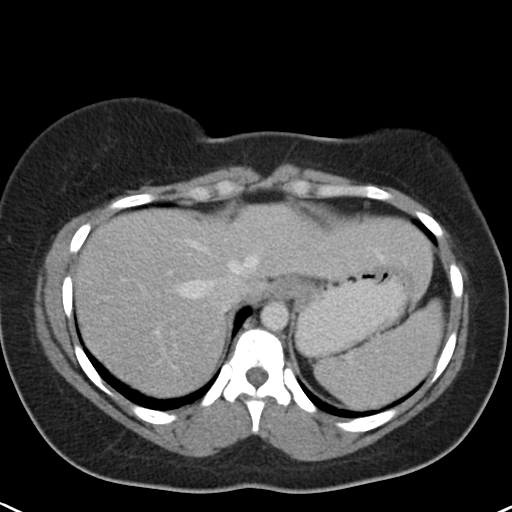
[im 81/85  soft-tissue]
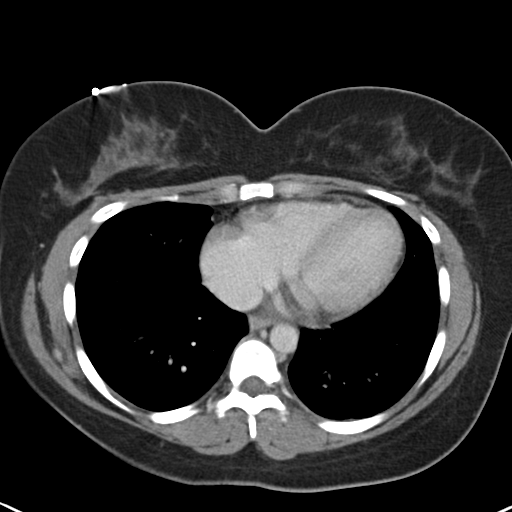

[Series 3: coronal a/|p · coronal · 0.67mm/px · 3 of 125 slices shown]
[im 42/125  soft-tissue]
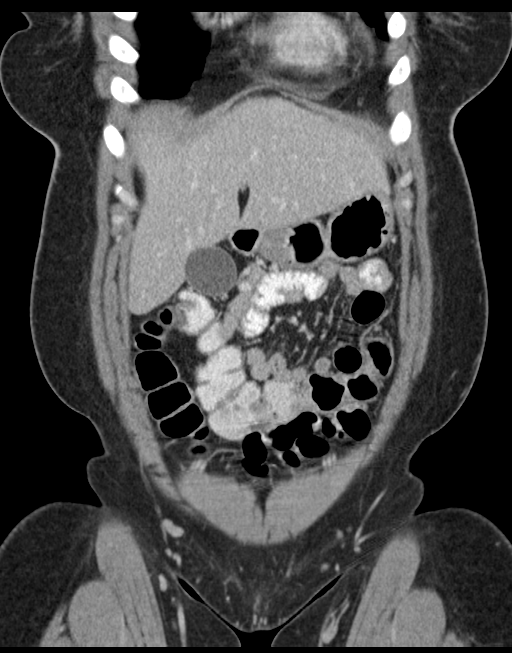
[im 56/125  soft-tissue]
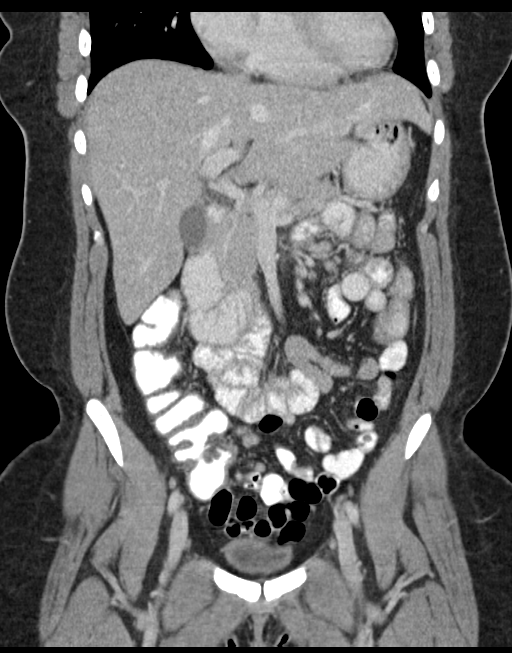
[im 69/125  soft-tissue]
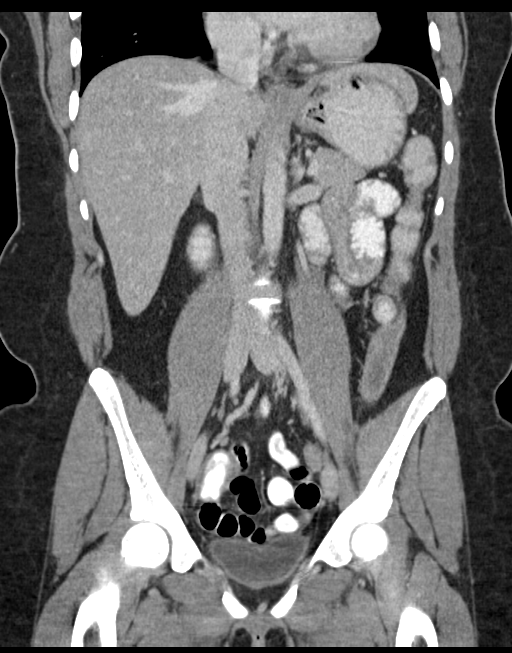

[15 of 46 positions shown; findings below may reference images not displayed]

FINDINGS: Lower chest: Lung bases are clear.

Hepatobiliary: No focal liver lesions are evident. Gallbladder wall
is not appreciably thickened. There is no appreciable biliary duct
dilatation.

Pancreas: There is no pancreatic mass or inflammatory focus.

Spleen: No splenic lesions are evident.

Adrenals/Urinary Tract: Adrenals appear unremarkable bilaterally.
Kidneys bilaterally show no mass or hydronephrosis on either side.
No renal or ureteral calculus is appreciable on either side. Urinary
bladder is midline with wall thickness within normal limits.

Stomach/Bowel: There is localized wall thickening in the cecum.
There is no fistula or abscess in this area. There is no other
appreciable bowel wall thickening. There is no evident bowel
obstruction. No free air or portal venous air.

Vascular/Lymphatic: There is no abdominal aortic aneurysm. No
vascular lesions are evident. There are a few subcentimeter lymph
nodes in the right lower quadrant region. By size criteria, there is
no adenopathy abdomen or pelvis.

Reproductive: The uterus is retroverted. There is no pelvic mass. A
small amount of free fluid is noted in the cul-de-sac region.

Other: Appendix is not seen. There is no appreciable periappendiceal
inflammatory type change. There is no abscess in the abdomen or
pelvis. There is a minimal ventral hernia containing only fat.

Musculoskeletal: There are no blastic or lytic bone lesions. There
is no intramuscular or abdominal wall lesion.
IMPRESSION: There is localized wall thickening in the cecum, likely of
inflammatory etiology. No fistula seen. No bowel obstruction. No
other bowel wall thickening evident. Note that the appendix is not
appreciable on this study. There is not appear to be periappendiceal
inflammation. There are a few subcentimeter lymph nodes in the right
lower quadrant which may well have inflammatory etiology given the
thickening of the wall of the cecum.

Small amount of free fluid in the cul-de-sac. A small amount of
fluid may be upper physiologic or could indicate recent ovarian cyst
rupture. It also could result secondary to the inflammation of the
cecum.

No demonstrable abscess. No renal or ureteral calculus. No
hydronephrosis.

## 2019-02-14 ENCOUNTER — Other Ambulatory Visit: Payer: Self-pay

## 2019-02-14 ENCOUNTER — Encounter: Payer: Self-pay | Admitting: Family Medicine

## 2019-02-14 ENCOUNTER — Ambulatory Visit (INDEPENDENT_AMBULATORY_CARE_PROVIDER_SITE_OTHER): Payer: 59 | Admitting: Family Medicine

## 2019-02-14 VITALS — BP 124/87 | HR 92 | Temp 99.0°F | Ht 65.0 in | Wt 177.0 lb

## 2019-02-14 DIAGNOSIS — Z01419 Encounter for gynecological examination (general) (routine) without abnormal findings: Secondary | ICD-10-CM | POA: Diagnosis not present

## 2019-02-14 DIAGNOSIS — R87612 Low grade squamous intraepithelial lesion on cytologic smear of cervix (LGSIL): Secondary | ICD-10-CM

## 2019-02-14 DIAGNOSIS — N949 Unspecified condition associated with female genital organs and menstrual cycle: Secondary | ICD-10-CM

## 2019-02-14 LAB — POCT URINALYSIS DIP (CLINITEK)
Bilirubin, UA: NEGATIVE
Blood, UA: NEGATIVE
Glucose, UA: NEGATIVE mg/dL
Leukocytes, UA: NEGATIVE
Nitrite, UA: NEGATIVE
POC PROTEIN,UA: NEGATIVE
Spec Grav, UA: 1.015 (ref 1.010–1.025)
Urobilinogen, UA: 0.2 E.U./dL
pH, UA: 6 (ref 5.0–8.0)

## 2019-02-14 MED ORDER — VALACYCLOVIR HCL 500 MG PO TABS: 500.0000 mg | ORAL_TABLET | Freq: Two times a day (BID) | ORAL | 5 refills | Status: AC

## 2019-02-14 MED ORDER — VALACYCLOVIR HCL 500 MG PO TABS: 500.0000 mg | ORAL_TABLET | Freq: Two times a day (BID) | ORAL | 5 refills | Status: DC

## 2019-02-14 NOTE — Patient Instructions (Addendum)

## 2019-02-14 NOTE — Progress Notes (Signed)
   [facility]  Annual GYN Examination Provider: Mike Gip, FNP   SUBJECTIVE: Kimberly Yoder is a 33 y.o. female who presents for her annual gynecological examination.  Current concerns: Patient not having herpes outbreaks and would like to take valtrex only with outbreaks. No other problems or concerns today.     GYNECOLOGICAL HISTORY: No LMP recorded. Contraception: abstinence Last Pap: 2016. Results were: unknown Last mammogram: never. No history of breast cancer in 1st degree relative.  OBSTETRIC HISTORY: OB History  No obstetric history on file.     The following portions of the patient's history were reviewed and updated as appropriate: allergies, current medications, past family history, past medical history, past social history, past surgical history and problem list.  REVIEW OF SYSTEMS: Review of Systems  Constitutional: Negative.   HENT: Negative.   Eyes: Negative.   Respiratory: Negative.   Cardiovascular: Negative.   Gastrointestinal: Negative.   Genitourinary: Negative.   Musculoskeletal: Negative.   Skin: Negative.   Neurological: Negative.   Psychiatric/Behavioral: Negative.       OBJECTIVE:  Physical Exam Constitutional:      General: She is not in acute distress.    Appearance: Normal appearance.  Genitourinary:     Pelvic exam was performed with patient in the lithotomy position.     Vulva, inguinal canal, vagina, cervix, uterus, right adnexa, left adnexa and rectum normal.  HENT:     Head: Normocephalic and atraumatic.  Cardiovascular:     Rate and Rhythm: Normal rate.  Pulmonary:     Effort: Pulmonary effort is normal.     Breath sounds: Normal breath sounds.  Neurological:     Mental Status: She is alert and oriented to person, place, and time.  Skin:    General: Skin is warm and dry.  Psychiatric:        Mood and Affect: Mood normal.        Behavior: Behavior normal.      Thought Content: Thought content normal.        Judgment: Judgment normal.  Vitals signs and nursing note reviewed. Exam conducted with a chaperone present.      ASSESSMENT/PLAN:  1. Well woman exam with routine gynecological exam - POCT URINALYSIS DIP (CLINITEK) - Pap IG, CT/NG w/ reflex HPV when ASC-[facility] [employer])  2. Genital lesion, female - valACYclovir (VALTREX) 500 MG tablet; Take 1 tablet (500 mg total) by mouth 2 (two) times daily for 5 days.  Dispense: 10 tablet; Refill: 5     Education reviewed: safe sex/STD prevention and self breast exams. Contraception: abstinence. Follow up in: 6 months.    Return to care as scheduled and prn. Patient verbalized understanding and agreed with plan of care.   Ms. Freda Jackson. Riley Lam, FNP-BC [facility] [address] [place], [place] [address] [phone]

## 2019-02-19 LAB — PAP IG, CT-NG, RFX HPV ASCU
Chlamydia, Nuc. Acid Amp: NEGATIVE
Gonococcus by Nucleic Acid Amp: NEGATIVE

## 2019-02-27 ENCOUNTER — Emergency Department (HOSPITAL_COMMUNITY)
Admission: EM | Admit: 2019-02-27 | Discharge: 2019-02-27 | Disposition: A | Discharge status: Home / Self Care | Payer: 59 | Attending: Emergency Medicine | Admitting: Emergency Medicine

## 2019-02-27 ENCOUNTER — Encounter (HOSPITAL_COMMUNITY): Payer: Self-pay | Admitting: Student

## 2019-02-27 ENCOUNTER — Other Ambulatory Visit: Payer: Self-pay

## 2019-02-27 DIAGNOSIS — Z87891 Personal history of nicotine dependence: Secondary | ICD-10-CM | POA: Insufficient documentation

## 2019-02-27 DIAGNOSIS — K0889 Other specified disorders of teeth and supporting structures: Secondary | ICD-10-CM | POA: Insufficient documentation

## 2019-02-27 HISTORY — DX: Disorder of teeth and supporting structures, unspecified: K08.9

## 2019-02-27 HISTORY — DX: Other chronic pain: G89.29

## 2019-02-27 MED ORDER — OXYCODONE-ACETAMINOPHEN 5-325 MG PO TABS
1.0000 | ORAL_TABLET | Freq: Once | ORAL | Status: AC
  Administered 2019-02-27: 1 via ORAL
  Filled 2019-02-27: qty 1

## 2019-02-27 MED ORDER — NAPROXEN 500 MG PO TABS: 500.0000 mg | ORAL_TABLET | Freq: Two times a day (BID) | ORAL | 0 refills | Status: DC

## 2019-02-27 MED ORDER — PENICILLIN V POTASSIUM 500 MG PO TABS: 500.0000 mg | ORAL_TABLET | Freq: Four times a day (QID) | ORAL | 0 refills | Status: AC

## 2019-02-27 NOTE — ED Provider Notes (Signed)
[facility]-EMERGENCY DEPT Provider Note   CSN: [id number] Arrival date & time: 02/27/19  1006    History   Chief Complaint Chief Complaint  Patient presents with  . Dental Pain    HPI Kimberly Yoder is a 33 y.o. female who presents to the ED with complaints of dental pain that began yesterday. Pain is to R upper/lower jaw & radiates to the R side of her face/head. Pain is constant, 10/10 in severity, no alleviating/aggravating factors. Hx of similar which improved w/ abx. Does not have a dentist currently. Denies fever, chills, sore throat, dysphagia, dyspnea, swelling beneath the tongue, trismus, drooling, or change in voice. Denies chance of pregnancy.      HPI  No past medical history on file.  Patient Active Problem List   Diagnosis Date Noted  . Weight gain 03/23/2017  . C. difficile colitis 03/03/2017  . Colitis 03/02/2017    No past surgical history on file.   OB History   No obstetric history on file.      Home Medications    Prior to Admission medications   Medication Sig Start Date End Date Taking? Authorizing Provider  acetaminophen (TYLENOL) 500 MG tablet Take 500 mg by mouth every 6 (six) hours as needed for moderate pain or headache.    [provider]    Family History Family History  Problem Relation Age of Onset  . CAD Other     Social History Social History   Tobacco Use  . Smoking status: Former Games developer  . Smokeless tobacco: Never Used  Substance Use Topics  . Alcohol use: Yes    Comment: occ  . Drug use: No     Allergies   Patient has no known allergies.   Review of Systems Review of Systems  Constitutional: Negative for chills and fever.  HENT: Positive for dental problem. Negative for congestion, drooling, ear pain, sore throat, trouble swallowing and voice change.   Respiratory: Negative for shortness of breath.   Gastrointestinal: Negative for nausea and vomiting.     Physical Exam  Updated Vital Signs BP (!) 157/99 (BP Location: Right Arm)   Pulse 72   Temp 98.6 F (37 C) (Oral)   Resp 18   Ht 5\' 5"  (1.651 m)   Wt 78.5 kg   SpO2 100%   BMI 28.79 kg/m   Physical Exam Vitals signs and nursing note reviewed.  Constitutional:      General: She is not in acute distress.    Appearance: She is well-developed. She is not toxic-appearing.  HENT:     Head: Normocephalic and atraumatic.     Right Ear: Tympanic membrane is not perforated, erythematous, retracted or bulging.     Left Ear: Tympanic membrane is not perforated, erythematous, retracted or bulging.     Nose: Nose normal.     Mouth/Throat:     Pharynx: Uvula midline. No oropharyngeal exudate, posterior oropharyngeal erythema or uvula swelling.     Tonsils: No tonsillar abscesses.     Comments: Mildly poor dentition noted throughout. Mild erythema and swelling noted to R upper/lower posterior molars. No palpable fluctuance or gross abscess.  Posterior oropharynx is symmetric appearing. Patient tolerating own secretions without difficulty. No trismus. No drooling. No hot potato voice. No swelling beneath the tongue, submandibular compartment is soft.   Eyes:     General:        Right eye: No discharge.        Left eye:  No discharge.     Conjunctiva/sclera: Conjunctivae normal.  Neck:     Musculoskeletal: Normal range of motion and neck supple. No edema, neck rigidity or crepitus.  Cardiovascular:     Rate and Rhythm: Normal rate.  Pulmonary:     Effort: Pulmonary effort is normal.  Lymphadenopathy:     Cervical: No cervical adenopathy.  Neurological:     General: No focal deficit present.     Mental Status: She is alert.  Psychiatric:        Behavior: Behavior normal.        Thought Content: Thought content normal.    ED Treatments / Results  Labs (all labs ordered are listed, but only abnormal results are displayed) Labs Reviewed - No data to display  EKG None  Radiology No results found.   Procedures Procedures (including critical care time)  Medications Ordered in ED Medications - No data to display   Initial Impression / Assessment and Plan / ED Course  I have reviewed the triage vital signs and the nursing notes.  Pertinent labs & imaging results that were available during my care of the patient were reviewed by me and considered in my medical decision making (see chart for details).    Patient presents with dental pain. Patient is nontoxic appearing, vitals without significant abnormality- BP elevated, doubt HTN emergency, PCP recheck. No gross abscess.  Exam unconcerning for Ludwig's angina or spread of infection.  Will treat with Penicillin VK and Naproxen.  Urged patient to follow-up with dentist, dental resources were provided.  Discussed treatment plan and need for follow up as well as return precautions. Provided opportunity for questions, patient confirmed understanding and is agreeable to plan.  Final Clinical Impressions(s) / ED Diagnoses   Final diagnoses:  Pain, dental    ED Discharge Orders         Ordered    naproxen (NAPROSYN) 500 MG tablet  2 times daily     02/27/19 1030    penicillin v potassium (VEETID) 500 MG tablet  4 times daily     02/27/19 [address] [place], PA-C 02/27/19 1036    Tilden Fossa, MD 02/27/19 1049

## 2019-02-27 NOTE — ED Notes (Addendum)
PT ABLE TO DRINK AND SWALLOW WITHOUT DIFFICULTY. PT WILL WAIT ON RIDE IN TRIAGE. INFORMED TO ASK FOR ASSISTANCE WHILE WAITING. PT VERBALIZED UNDERSTANDING.

## 2019-02-27 NOTE — ED Notes (Signed)
PERCOCET GIVEN AT DISCHARGE

## 2019-02-27 NOTE — Discharge Instructions (Signed)
Call one of the dentists offices provided to schedule an appointment for re-evaluation and further management within the next 48 hours.   I have prescribed you Penicillin VK which is an antibiotic to treat the infection and Naproxen which is an anti-inflammatory medicine to treat the pain.   Please take all of your antibiotics until finished. You may develop abdominal discomfort or diarrhea from the antibiotic.  You may help offset this with probiotics which you can buy at the store (ask your pharmacist if unable to find) or get probiotics in the form of eating yogurt. Do not eat or take the probiotics until 2 hours after your antibiotic. If you are unable to tolerate these side effects follow-up with your primary care provider or return to the emergency department.   If you begin to experience any blistering, rashes, swelling, or difficulty breathing seek medical care for evaluation of potentially more serious side effects.   Be sure to eat something when taking the Naproxen as it can cause stomach upset and at worst stomach bleeding. Do not take additional non steroidal anti-inflammatory medicines such as Ibuprofen, Aleve, Advil, Mobic, Diclofenac, or goodie powder while taking Naproxen. You may supplement with Tylenol.   We have prescribed you new medication(s) today. Discuss the medications prescribed today with your pharmacist as they can have adverse effects and interactions with your other medicines including over the counter and prescribed medications. Seek medical evaluation if you start to experience new or abnormal symptoms after taking one of these medicines, seek care immediately if you start to experience difficulty breathing, feeling of your throat closing, facial swelling, or rash as these could be indications of a more serious allergic reaction  If you start to experience and new or worsening symptoms return to the emergency department. If you start to experience fever, chills, neck  stiffness/pain, or inability to move your neck or open your mouth come back to the emergency department immediately.   

## 2019-02-27 NOTE — ED Notes (Signed)
EDPA Provider at bedside. 

## 2019-02-27 NOTE — ED Notes (Signed)
CRACKERS GIVEN

## 2019-02-27 NOTE — ED Triage Notes (Signed)
Dental pain x 1 day. Right side pain. Right facial pain. HX of the same. Denies injury. EDPA present

## 2019-03-04 ENCOUNTER — Emergency Department (HOSPITAL_COMMUNITY)
Admission: EM | Admit: 2019-03-04 | Discharge: 2019-03-04 | Disposition: A | Discharge status: Home / Self Care | Payer: 59 | Attending: Emergency Medicine | Admitting: Emergency Medicine

## 2019-03-04 ENCOUNTER — Other Ambulatory Visit: Payer: Self-pay

## 2019-03-04 ENCOUNTER — Encounter (HOSPITAL_COMMUNITY): Payer: Self-pay | Admitting: *Deleted

## 2019-03-04 DIAGNOSIS — K0889 Other specified disorders of teeth and supporting structures: Secondary | ICD-10-CM | POA: Diagnosis present

## 2019-03-04 DIAGNOSIS — M26621 Arthralgia of right temporomandibular joint: Secondary | ICD-10-CM | POA: Diagnosis not present

## 2019-03-04 DIAGNOSIS — Z79899 Other long term (current) drug therapy: Secondary | ICD-10-CM | POA: Insufficient documentation

## 2019-03-04 DIAGNOSIS — Z87891 Personal history of nicotine dependence: Secondary | ICD-10-CM | POA: Diagnosis not present

## 2019-03-04 MED ORDER — HYDROCODONE-ACETAMINOPHEN 5-325 MG PO TABS: 1.0000 | ORAL_TABLET | Freq: Four times a day (QID) | ORAL | 0 refills | Status: DC | PRN

## 2019-03-04 MED ORDER — HYDROCODONE-ACETAMINOPHEN 5-325 MG PO TABS
2.0000 | ORAL_TABLET | Freq: Once | ORAL | Status: AC
  Administered 2019-03-04: 2 via ORAL
  Filled 2019-03-04: qty 2

## 2019-03-04 NOTE — ED Provider Notes (Signed)
[facility]-EMERGENCY DEPT Provider Note   CSN: [id number] Arrival date & time: 03/04/19  1939    History   Chief Complaint Chief Complaint  Patient presents with  . Dental Pain    HPI Kimberly Yoder is a 33 y.o. female.     Patient is a 33 year old female who presents with dental pain.  She has had some worsening pain in her right jaw for about the last week.  She was seen here on March 31 for similar symptoms.  She was started on penicillin and Naprosyn.  She felt like it was getting a little bit better initially but now has gotten worse again.  She denies any facial swelling.  She cannot really tell exactly where the pain is coming from.  She cannot locate it to his particular tooth.  She says it is both her upper and lower jaw and radiating toward her tooth.  She denies any numbness to her face.  No rashes.  No specific ear pain.  No known fevers.  No pain in her throat.  No difficulty swallowing.  She is called a Education officer, community but cannot get an appointment for several weeks.     Past Medical History:  Diagnosis Date  . Chronic dental pain     Patient Active Problem List   Diagnosis Date Noted  . Weight gain 03/23/2017  . C. difficile colitis 03/03/2017  . Colitis 03/02/2017    History reviewed. No pertinent surgical history.   OB History   No obstetric history on file.      Home Medications    Prior to Admission medications   Medication Sig Start Date End Date Taking? Authorizing Provider  HYDROcodone-acetaminophen (NORCO/VICODIN) 5-325 MG tablet Take 1-2 tablets by mouth every 6 (six) hours as needed. 03/04/19   Rolan Bucco, MD  naproxen (NAPROSYN) 500 MG tablet Take 1 tablet (500 mg total) by mouth 2 (two) times daily. 02/27/19   Petrucelli, Samantha R, PA-C  penicillin v potassium (VEETID) 500 MG tablet Take 1 tablet (500 mg total) by mouth 4 (four) times daily for 7 days. 02/27/19 03/06/19  Petrucelli, Pleas Koch, PA-C    Family History  Family History  Problem Relation Age of Onset  . CAD Other     Social History Social History   Tobacco Use  . Smoking status: Former Games developer  . Smokeless tobacco: Never Used  Substance Use Topics  . Alcohol use: Yes    Comment: occ  . Drug use: No     Allergies   Patient has no known allergies.   Review of Systems Review of Systems  Constitutional: Negative for fever.  HENT: Positive for dental problem. Negative for congestion, drooling, ear discharge, ear pain, facial swelling, hearing loss, mouth sores, sore throat, trouble swallowing and voice change.   Eyes: Negative for pain.  Respiratory: Negative for shortness of breath.   Gastrointestinal: Negative for nausea and vomiting.  Musculoskeletal: Negative for neck pain.  Skin: Negative for rash and wound.  Neurological: Negative for headaches.     Physical Exam Updated Vital Signs BP 131/86 (BP Location: Right Arm)   Pulse 91   Temp 98.4 F (36.9 C) (Oral)   Resp 18   Ht 5\' 5"  (1.651 m)   Wt 78.5 kg   LMP 02/13/2019 (Approximate)   SpO2 100%   BMI 28.79 kg/m   Physical Exam Constitutional:      Appearance: Normal appearance.  HENT:     Head: Normocephalic and atraumatic.  Right Ear: Tympanic membrane, ear canal and external ear normal.     Mouth/Throat:     Comments: Patient has a decayed right upper back molar.  There is no swelling around the tooth.  No tenderness on direct palpation of the teeth either in the upper or lower jaw.  No trismus.  No elevation of the tongue.  No submandibular swelling.  No rashes are noted.  She has tenderness to the right TMJ area.  No clicking of the jaw is noted.  No malocclusion is noted. Neck:     Musculoskeletal: Normal range of motion and neck supple.  Cardiovascular:     Rate and Rhythm: Normal rate.  Pulmonary:     Effort: Pulmonary effort is normal.  Skin:    General: Skin is warm.  Neurological:     Mental Status: She is alert.      ED Treatments /  Results  Labs (all labs ordered are listed, but only abnormal results are displayed) Labs Reviewed - No data to display  EKG None  Radiology No results found.  Procedures Procedures (including critical care time)  Medications Ordered in ED Medications  HYDROcodone-acetaminophen (NORCO/VICODIN) 5-325 MG per tablet 2 tablet (2 tablets Oral Given 03/04/19 2008)     Initial Impression / Assessment and Plan / ED Course  I have reviewed the triage vital signs and the nursing notes.  Pertinent labs & imaging results that were available during my care of the patient were reviewed by me and considered in my medical decision making (see chart for details).        Patient has pain in the right jaw.  It is unclear exactly the etiology of the pain.  It could be coming from the right upper molar which is decayed however there is no direct tenderness on palpation of this area.  She says it is more generally tender around the area.  There is some tenderness over the TMJ on palpation.  There is no pain over the temporal artery.  This is likely resulting from inflammation the TMJ.  She was discharged home in good condition.  She was encouraged to continue Naprosyn and was started on a short course of Vicodin.  She was encouraged to either follow-up with a dentist or her primary care physician.  Return precautions were given.  Final Clinical Impressions(s) / ED Diagnoses   Final diagnoses:  Arthralgia of right temporomandibular joint    ED Discharge Orders         Ordered    HYDROcodone-acetaminophen (NORCO/VICODIN) 5-325 MG tablet  Every 6 hours PRN     03/04/19 2004           Rolan Bucco, MD 03/04/19 2034

## 2019-03-04 NOTE — ED Triage Notes (Signed)
Pt c/o continued pain to right side of mouth, has been unable to locate a dentist.

## 2019-03-04 NOTE — ED Notes (Signed)
Bed: WTR5 Expected date:  Expected time:  Means of arrival:  Comments: 

## 2019-03-05 ENCOUNTER — Other Ambulatory Visit: Payer: Self-pay | Admitting: Family Medicine

## 2019-03-05 NOTE — Addendum Note (Signed)
Addended by: Reatha Harps on: 03/05/2019 12:34 PM   Modules accepted: Orders

## 2019-04-16 ENCOUNTER — Telehealth: Payer: Self-pay

## 2019-04-17 NOTE — Telephone Encounter (Signed)
Called, no answer. Left a message for patient to call back.  

## 2019-08-08 ENCOUNTER — Encounter (HOSPITAL_COMMUNITY): Payer: Self-pay

## 2019-08-08 ENCOUNTER — Encounter (HOSPITAL_COMMUNITY): Payer: Self-pay | Admitting: *Deleted

## 2019-08-16 ENCOUNTER — Ambulatory Visit: Payer: 59 | Admitting: Family Medicine

## 2019-08-17 ENCOUNTER — Ambulatory Visit: Payer: 59 | Admitting: Family Medicine

## 2019-09-03 ENCOUNTER — Emergency Department (HOSPITAL_COMMUNITY)
Admission: EM | Admit: 2019-09-03 | Discharge: 2019-09-04 | Disposition: A | Discharge status: Home / Self Care | Payer: 59 | Attending: Emergency Medicine | Admitting: Emergency Medicine

## 2019-09-03 ENCOUNTER — Other Ambulatory Visit: Payer: Self-pay

## 2019-09-03 ENCOUNTER — Encounter (HOSPITAL_COMMUNITY): Payer: Self-pay | Admitting: Emergency Medicine

## 2019-09-03 DIAGNOSIS — Z87891 Personal history of nicotine dependence: Secondary | ICD-10-CM | POA: Insufficient documentation

## 2019-09-03 DIAGNOSIS — R059 Cough, unspecified: Secondary | ICD-10-CM

## 2019-09-03 DIAGNOSIS — Z791 Long term (current) use of non-steroidal anti-inflammatories (NSAID): Secondary | ICD-10-CM | POA: Diagnosis not present

## 2019-09-03 DIAGNOSIS — R05 Cough: Secondary | ICD-10-CM | POA: Diagnosis present

## 2019-09-03 NOTE — ED Provider Notes (Signed)
[facility]-EMERGENCY DEPT Provider Note   CSN: [id number] Arrival date & time: 09/03/19  1621     History   Chief Complaint Chief Complaint  Patient presents with  . work note  . Cough    HPI Kimberly Yoder is a 33 y.o. female.     33 year old female presents for a work note.  States that 3 days ago she had cough and congestion.  Nonproductive cough remains without fever or chills.  Not taking any fever reducers.  No vomiting or diarrhea.  Feels much better.     Past Medical History:  Diagnosis Date  . Chronic dental pain     Patient Active Problem List   Diagnosis Date Noted  . Weight gain 03/23/2017  . C. difficile colitis 03/03/2017  . Colitis 03/02/2017    History reviewed. No pertinent surgical history.   OB History   No obstetric history on file.      Home Medications    Prior to Admission medications   Medication Sig Start Date End Date Taking? Authorizing Provider  HYDROcodone-acetaminophen (NORCO/VICODIN) 5-325 MG tablet Take 1-2 tablets by mouth every 6 (six) hours as needed. 03/04/19   Rolan Bucco, MD  naproxen (NAPROSYN) 500 MG tablet Take 1 tablet (500 mg total) by mouth 2 (two) times daily. 02/27/19   Petrucelli, Pleas Koch, PA-C    Family History Family History  Problem Relation Age of Onset  . CAD Other     Social History Social History   Tobacco Use  . Smoking status: Former Games developer  . Smokeless tobacco: Never Used  Substance Use Topics  . Alcohol use: Yes    Comment: occ  . Drug use: No     Allergies   Patient has no known allergies.   Review of Systems Review of Systems  All other systems reviewed and are negative.    Physical Exam Updated Vital Signs BP (!) 132/92 (BP Location: Right Arm)   Pulse 78   Temp 99.3 F (37.4 C) (Oral)   Resp 18   LMP 08/27/2019   SpO2 100%   Physical Exam Vitals signs and nursing note reviewed.  Constitutional:      Appearance: She is well-developed.  She is not toxic-appearing.  HENT:     Head: Normocephalic and atraumatic.  Eyes:     Conjunctiva/sclera: Conjunctivae normal.     Pupils: Pupils are equal, round, and reactive to light.  Neck:     Musculoskeletal: Normal range of motion.  Cardiovascular:     Rate and Rhythm: Normal rate.  Pulmonary:     Effort: Pulmonary effort is normal.  Skin:    General: Skin is warm and dry.  Neurological:     Mental Status: She is alert and oriented to person, place, and time.      ED Treatments / Results  Labs (all labs ordered are listed, but only abnormal results are displayed) Labs Reviewed - No data to display  EKG None  Radiology No results found.  Procedures Procedures (including critical care time)  Medications Ordered in ED Medications - No data to display   Initial Impression / Assessment and Plan / ED Course  I have reviewed the triage vital signs and the nursing notes.  Pertinent labs & imaging results that were available during my care of the patient were reviewed by me and considered in my medical decision making (see chart for details).        Patient afebrile here.  Pulse  oximetry is stable.  Will give work note  Final Clinical Impressions(s) / ED Diagnoses   Final diagnoses:  None    ED Discharge Orders    None       Lacretia Leigh, MD 09/03/19 2246

## 2019-09-03 NOTE — ED Triage Notes (Signed)
Pt reports had cough with phlegm at times since last Wed. reports her work wont let her back to work until has note being cleared.

## 2019-09-05 ENCOUNTER — Other Ambulatory Visit: Payer: Self-pay

## 2019-09-05 DIAGNOSIS — Z20822 Contact with and (suspected) exposure to covid-19: Secondary | ICD-10-CM

## 2019-09-07 LAB — NOVEL CORONAVIRUS, NAA: SARS-CoV-2, NAA: NOT DETECTED

## 2020-09-15 ENCOUNTER — Other Ambulatory Visit: Payer: Self-pay | Admitting: Family Medicine

## 2020-09-15 DIAGNOSIS — N949 Unspecified condition associated with female genital organs and menstrual cycle: Secondary | ICD-10-CM

## 2022-10-25 ENCOUNTER — Ambulatory Visit: Payer: Self-pay | Admitting: Family Medicine

## 2023-02-28 ENCOUNTER — Ambulatory Visit: Payer: Self-pay | Admitting: Family

## 2023-02-28 ENCOUNTER — Encounter: Payer: Self-pay | Admitting: Family

## 2023-02-28 VITALS — BP 138/98 | HR 88 | Temp 97.9°F | Resp 16 | Ht 65.75 in | Wt 171.8 lb

## 2023-02-28 DIAGNOSIS — Z7689 Persons encountering health services in other specified circumstances: Secondary | ICD-10-CM

## 2023-02-28 DIAGNOSIS — A6004 Herpesviral vulvovaginitis: Secondary | ICD-10-CM

## 2023-02-28 DIAGNOSIS — R21 Rash and other nonspecific skin eruption: Secondary | ICD-10-CM

## 2023-02-28 DIAGNOSIS — Z1159 Encounter for screening for other viral diseases: Secondary | ICD-10-CM

## 2023-02-28 MED ORDER — VALACYCLOVIR HCL 1 G PO TABS: 1000.0000 mg | ORAL_TABLET | Freq: Two times a day (BID) | ORAL | 1 refills | Status: DC

## 2023-02-28 MED ORDER — CETIRIZINE HCL 10 MG PO TABS
10.0000 mg | ORAL_TABLET | Freq: Every day | ORAL | 0 refills | Status: DC
Start: 2023-02-28 — End: 2024-05-31

## 2023-02-28 NOTE — Progress Notes (Signed)
Provider: Amairany Schumpert FNP-C   Lanae Boast, [place] (Inactive)  Patient Care Team: Lanae Boast, [place] (Inactive) as PCP - General (Family Medicine)  Extended Emergency Contact Information Primary Emergency Contact: Mastro,Linda Address: [address], [place] [address] [place] Mobile Phone: [phone] Relation: Mother  Code Status:  Full Code  Goals of care: Advanced Directive information    09/03/2019    4:39 PM  Advanced Directives  Does Patient Have a Medical Advance Directive? No     Chief Complaint  Patient presents with   Establish Care    Has some skin irritation thinks it from sun just got back from [place] on her arms and itches.    HPI:  Pt is a 37 y.o. female seen today establish care here at [facility] Adult  care for medical management of chronic diseases.states does not take medication on regular basis. Request refills of valacyclovir has break out of genital herpes every now and then would like to have medication at hand.  LMP states menses current on going. No complication.  States recently returned from [place] has noticed red rash on both forearms.states applied sunscreen while she was there.Describes rash as small red and itchy.states rash has improved since she returned but not resolved.No insect bites reported.also denies any fever,chills,Nausea or vomiting.   Past Medical History:  Diagnosis Date   Chronic dental pain    Genital herpes    History reviewed. No pertinent surgical history.  No Known Allergies  Allergies as of 02/28/2023   No Known Allergies      Medication List        Accurate as of February 28, 2023  2:22 PM. If you have any questions, ask your nurse or doctor.          HYDROcodone-acetaminophen 5-325 MG tablet Commonly known as: NORCO/VICODIN Take 1-2 tablets by mouth every 6 (six) hours as needed.   naproxen 500 MG tablet Commonly known as: NAPROSYN Take 1 tablet  (500 mg total) by mouth 2 (two) times daily.        Review of Systems  Constitutional:  Negative for appetite change, chills, fatigue, fever and unexpected weight change.  HENT:  Negative for congestion, dental problem, ear discharge, ear pain, facial swelling, hearing loss, nosebleeds, postnasal drip, rhinorrhea, sinus pressure, sinus pain, sneezing, sore throat, tinnitus and trouble swallowing.   Eyes:  Negative for pain, discharge, redness, itching and visual disturbance.  Respiratory:  Negative for cough, chest tightness, shortness of breath and wheezing.   Cardiovascular:  Negative for chest pain, palpitations and leg swelling.  Gastrointestinal:  Negative for abdominal distention, abdominal pain, blood in stool, constipation, diarrhea, nausea and vomiting.  Endocrine: Negative for cold intolerance, heat intolerance, polydipsia, polyphagia and polyuria.  Genitourinary:  Negative for difficulty urinating, dysuria, flank pain, frequency and urgency.  Musculoskeletal:  Negative for arthralgias, back pain, gait problem, joint swelling, myalgias, neck pain and neck stiffness.  Skin:  Positive for rash. Negative for color change, pallor and wound.       Rah on forearms   Neurological:  Negative for dizziness, syncope, speech difficulty, weakness, light-headedness, numbness and headaches.  Hematological:  Does not bruise/bleed easily.  Psychiatric/Behavioral:  Negative for agitation, behavioral problems, confusion, hallucinations, self-injury, sleep disturbance and suicidal ideas. The patient is not nervous/anxious.      There is no immunization history on file for this patient. Pertinent  Health  Maintenance Due  Topic Date Due   PAP SMEAR-Modifier  02/13/2022   INFLUENZA VACCINE  06/30/2023      02/27/2019   10:22 AM 03/04/2019    8:03 PM 09/03/2019    4:40 PM 09/04/2019   12:01 AM 02/28/2023    1:51 PM  [place] in the past year?     0  Was there an injury with Fall?     0   Fall Risk Category Calculator     0  (RETIRED) Patient Fall Risk Level Low fall risk Low fall risk Low fall risk Low fall risk    Functional Status Survey:    Vitals:   02/28/23 1351  BP: (!) 138/98  Pulse: 88  Resp: 16  Temp: 97.9 F (36.6 C)  TempSrc: Temporal  SpO2: 97%  Weight: 171 lb 12.8 oz (77.9 kg)  Height: 5' 5.75" (1.67 m)   Body mass index is 27.94 kg/m. Physical Exam Vitals reviewed.  Constitutional:      General: She is not in acute distress.    Appearance: Normal appearance. She is overweight. She is not ill-appearing or diaphoretic.  HENT:     Head: Normocephalic.     Right Ear: Tympanic membrane, ear canal and external ear normal. There is no impacted cerumen.     Left Ear: Tympanic membrane, ear canal and external ear normal. There is no impacted cerumen.     Nose: Nose normal. No congestion or rhinorrhea.     Mouth/Throat:     Mouth: Mucous membranes are moist.     Pharynx: Oropharynx is clear. No oropharyngeal exudate or posterior oropharyngeal erythema.  Eyes:     General: No scleral icterus.       Right eye: No discharge.        Left eye: No discharge.     Extraocular Movements: Extraocular movements intact.     Conjunctiva/sclera: Conjunctivae normal.     Pupils: Pupils are equal, round, and reactive to light.  Neck:     Vascular: No carotid bruit.  Cardiovascular:     Rate and Rhythm: Normal rate and regular rhythm.     Pulses: Normal pulses.     Heart sounds: Normal heart sounds. No murmur heard.    No friction rub. No gallop.  Pulmonary:     Effort: Pulmonary effort is normal. No respiratory distress.     Breath sounds: Normal breath sounds. No wheezing, rhonchi or rales.  Chest:     Chest wall: No tenderness.  Abdominal:     General: Bowel sounds are normal. There is no distension.     Palpations: Abdomen is soft. There is no mass.     Tenderness: There is no abdominal tenderness. There is no right CVA tenderness, left CVA tenderness,  guarding or rebound.  Musculoskeletal:        General: No swelling or tenderness. Normal range of motion.     Cervical back: Normal range of motion. No rigidity or tenderness.     Right lower leg: No edema.     Left lower leg: No edema.  Lymphadenopathy:     Cervical: No cervical adenopathy.  Skin:    General: Skin is warm and dry.     Coloration: Skin is not pale.     Findings: Rash present. No bruising, erythema or lesion.     Comments: Red non-raised, nonblanchable diffused rash forearms.non-tender and without any drainage.   Neurological:     Mental Status: She is  alert and oriented to person, place, and time.     Cranial Nerves: No cranial nerve deficit.     Sensory: No sensory deficit.     Motor: No weakness.     Coordination: Coordination normal.     Gait: Gait normal.  Psychiatric:        Mood and Affect: Mood normal.        Speech: Speech normal.        Behavior: Behavior normal.        Thought Content: Thought content normal.        Judgment: Judgment normal.     Labs reviewed: No results for input(s): "NA", "K", "CL", "CO2", "GLUCOSE", "BUN", "CREATININE", "CALCIUM", "MG", "PHOS" in the last 8760 hours. No results for input(s): "AST", "ALT", "ALKPHOS", "BILITOT", "PROT", "ALBUMIN" in the last 8760 hours. No results for input(s): "WBC", "NEUTROABS", "HGB", "HCT", "MCV", "PLT" in the last 8760 hours. Lab Results  Component Value Date   TSH 1.65 03/22/2017   Lab Results  Component Value Date   HGBA1C 4.9 03/22/2017   No results found for: "CHOL", "HDL", "LDLCALC", "LDLDIRECT", "TRIG", "CHOLHDL"  Significant Diagnostic Results in last 30 days:  No results found.  Assessment/Plan 1. Herpes simplex vulvovaginitis Valacyclovir for prophylaxis  - valACYclovir (VALTREX) 1000 MG tablet; Take 1 tablet (1,000 mg total) by mouth 2 (two) times daily.  Dispense: 14 tablet; Refill: 1  2. Encounter to establish care Available records reviewed,immunization up to date  except due for Tdap.recommend Tdap vaccine this visit but states would like to get on upcoming visit.recommended scheduling for fasting labs.   3. Encounter for hepatitis C screening test for low risk patient Low risk  - Hep C Antibody  4. Rash and nonspecific skin eruption Afebrile  Red non-raised, nonblanchable diffused rash forearms.non-tender and without any drainage.  - CBC with Differential/Platelet; Future - COMPLETE METABOLIC PANEL WITH GFR - cetirizine (ZYRTEC) 10 MG tablet; Take 1 tablet (10 mg total) by mouth daily for 14 days.  Dispense: 14 tablet; Refill: 0  Family/ staff Communication: Reviewed plan of care with patient verbalized understanding   Labs/tests ordered: - CBC with Differential/Platelet - CMP with eGFR([facility]) - Hep C Antibody  Next Appointment : Return in about 1 week (around 03/07/2023) for Pap smear ,Fasting labs and Tdap vaccine .   Sandrea Hughs, NP

## 2023-03-07 ENCOUNTER — Ambulatory Visit: Payer: Self-pay | Admitting: Family

## 2023-03-09 ENCOUNTER — Ambulatory Visit (INDEPENDENT_AMBULATORY_CARE_PROVIDER_SITE_OTHER): Payer: No Typology Code available for payment source | Admitting: Family

## 2023-03-09 ENCOUNTER — Encounter: Payer: Self-pay | Admitting: Family

## 2023-03-09 VITALS — BP 122/86 | HR 91 | Temp 97.6°F | Resp 16 | Ht 65.75 in | Wt 170.4 lb

## 2023-03-09 DIAGNOSIS — E663 Overweight: Secondary | ICD-10-CM | POA: Diagnosis not present

## 2023-03-09 DIAGNOSIS — Z1159 Encounter for screening for other viral diseases: Secondary | ICD-10-CM

## 2023-03-09 DIAGNOSIS — Z124 Encounter for screening for malignant neoplasm of cervix: Secondary | ICD-10-CM

## 2023-03-09 DIAGNOSIS — Z23 Encounter for immunization: Secondary | ICD-10-CM

## 2023-03-09 NOTE — Progress Notes (Signed)
Provider: Richarda Blade FNP-C  Mahkayla Preece, Donalee Citrin, NP  Patient Care Team: Avelina Mcclurkin, Donalee Citrin, NP as PCP - General (Family Medicine)  Extended Emergency Contact Information Primary Emergency Contact: Neubauer,Linda Address: [address]          [place], [place] [address] [place] Mobile Phone: [phone] Relation: Mother Secondary Emergency Contact: Johnson,Shikil Mobile Phone: [phone] Relation: Friend  Code Status:  Full Code  Goals of care: Advanced Directive information    09/03/2019    4:39 PM  Advanced Directives  Does Patient Have a Medical Advance Directive? No     Chief Complaint  Patient presents with   Gynecologic Exam    Pap, tdap and blood work. Wants to know if she can go from 2 pills to 1 pill day on valtrex    HPI:  Pt is a 37 y.o. female seen today for pap smear,fasting labs and Tdap vaccine. She denies any abdominal pain,bloating,nausea ,vomiting,bleeding or discharge. States does not do own breast exam. Monthly Breast exam discussed and demonstrated during visit. Advised to avoid exam few days prior to her menses due to tenderness.   Past Medical History:  Diagnosis Date   Chronic dental pain    Genital herpes    No past surgical history on file.  No Known Allergies  Outpatient Encounter Medications as of 03/09/2023  Medication Sig   cetirizine (ZYRTEC) 10 MG tablet Take 1 tablet (10 mg total) by mouth daily for 14 days.   valACYclovir (VALTREX) 1000 MG tablet Take 1 tablet (1,000 mg total) by mouth 2 (two) times daily.   No facility-administered encounter medications on file as of 03/09/2023.    Review of Systems  Constitutional:  Negative for appetite change, chills, fatigue, fever and unexpected weight change.  Respiratory:  Negative for cough, chest tightness, shortness of breath and wheezing.   Cardiovascular:  Negative for chest pain, palpitations and leg swelling.  Gastrointestinal:  Negative for abdominal  distention, abdominal pain, blood in stool, constipation, diarrhea, nausea and vomiting.  Genitourinary:  Negative for difficulty urinating, dysuria, flank pain, frequency, genital sores, hematuria, menstrual problem, urgency, vaginal bleeding, vaginal discharge and vaginal pain.  Musculoskeletal:  Negative for arthralgias, back pain, gait problem, joint swelling, myalgias, neck pain and neck stiffness.  Skin:  Negative for color change, pallor, rash and wound.  Psychiatric/Behavioral:  Negative for agitation, behavioral problems, confusion, hallucinations and sleep disturbance. The patient is not nervous/anxious.     There is no immunization history on file for this patient. Pertinent  Health Maintenance Due  Topic Date Due   PAP SMEAR-Modifier  02/13/2022   INFLUENZA VACCINE  06/30/2023      02/27/2019   10:22 AM 03/04/2019    8:03 PM 09/03/2019    4:40 PM 09/04/2019   12:01 AM 02/28/2023    1:51 PM  Fall Risk  Falls in the past year?     0  Was there an injury with Fall?     0  Fall Risk Category Calculator     0  (RETIRED) Patient Fall Risk Level Low fall risk Low fall risk Low fall risk Low fall risk    Functional Status Survey:    Vitals:   03/09/23 1447  BP: 122/86  Pulse: 91  Resp: 16  Temp: 97.6 F (36.4 C)  TempSrc: Temporal  SpO2: 98%  Weight: 170 lb 6.4 oz (77.3 kg)  Height: 5' 5.75" (1.67 m)   Body mass index is 27.71 kg/m.  Physical Exam Vitals reviewed. Exam conducted with a chaperone present [facility] Dillard,CMA).  Constitutional:      General: She is not in acute distress.    Appearance: Normal appearance. She is normal weight. She is not ill-appearing or diaphoretic.  HENT:     Head: Normocephalic.  Cardiovascular:     Rate and Rhythm: Normal rate and regular rhythm.     Pulses: Normal pulses.     Heart sounds: Normal heart sounds. No murmur heard.    No friction rub. No gallop.  Pulmonary:     Effort: Pulmonary effort is normal. No respiratory  distress.     Breath sounds: Normal breath sounds. No wheezing, rhonchi or rales.  Chest:     Chest wall: No tenderness.  Breasts:    Right: Normal.     Left: Normal.  Abdominal:     General: Bowel sounds are normal. There is no distension.     Palpations: Abdomen is soft. There is no mass.     Tenderness: There is no abdominal tenderness. There is no right CVA tenderness, left CVA tenderness, guarding or rebound.     Hernia: There is no hernia in the left inguinal area or right inguinal area.  Genitourinary:    Exam position: Lithotomy position.     Labia:        Right: No rash, tenderness or lesion.        Left: No rash, tenderness or lesion.      Urethra: No prolapse, urethral pain, urethral swelling or urethral lesion.     Vagina: Vaginal discharge present. No erythema, tenderness, bleeding, lesions or prolapsed vaginal walls.     Cervix: Normal.     Uterus: Normal.      Adnexa: Right adnexa normal and left adnexa normal.     Comments: Small amounts of thin gray whitish discharge noted  Musculoskeletal:        General: No swelling or tenderness. Normal range of motion.     Right lower leg: No edema.     Left lower leg: No edema.  Lymphadenopathy:     Upper Body:     Right upper body: No supraclavicular, axillary or pectoral adenopathy.     Left upper body: No supraclavicular, axillary or pectoral adenopathy.     Lower Body: No right inguinal adenopathy. No left inguinal adenopathy.  Skin:    General: Skin is warm and dry.     Coloration: Skin is not pale.     Findings: No bruising, erythema, lesion or rash.  Neurological:     Mental Status: She is alert and oriented to person, place, and time.     Cranial Nerves: No cranial nerve deficit.     Sensory: No sensory deficit.     Motor: No weakness.     Coordination: Coordination normal.     Gait: Gait normal.  Psychiatric:        Mood and Affect: Mood normal.        Speech: Speech normal.        Behavior: Behavior  normal.        Thought Content: Thought content normal.        Judgment: Judgment normal.     Labs reviewed: No results for input(s): "NA", "K", "CL", "CO2", "GLUCOSE", "BUN", "CREATININE", "CALCIUM", "MG", "PHOS" in the last 8760 hours. No results for input(s): "AST", "ALT", "ALKPHOS", "BILITOT", "PROT", "ALBUMIN" in the last 8760 hours. No results for input(s): "WBC", "NEUTROABS", "HGB", "HCT", "MCV", "PLT" in  the last 8760 hours. Lab Results  Component Value Date   TSH 1.65 03/22/2017   Lab Results  Component Value Date   HGBA1C 4.9 03/22/2017   No results found for: "CHOL", "HDL", "LDLCALC", "LDLDIRECT", "TRIG", "CHOLHDL"  Significant Diagnostic Results in last 30 days:  No results found.  Assessment/Plan  1. Cervical cancer screening Chaperone Jasmine Dillard,CMA present throughout the exam.Tolerated procedure well.  No bleeding noted except small amounts of thin gray whitish discharge noted.Breast exam normal.  Advised to do breast exam at least once a month to avoid examining breast few days prior to her M enses due to tenderness - PAP, IG w/ Reflex HPV when ASCU [[facility]]  2. Overweight (BMI 25.0-29.9) BMI 27.71  -Dietary modification and exercise advised - CBC with Differential/Platelet - COMPLETE METABOLIC PANEL WITH GFR - TSH  3. Encounter for hepatitis C screening test for low risk patient Low risk - Hep C Antibody  Family/ staff Communication: Reviewed plan of care with patient verbalized understanding  Labs/tests ordered: - Hep C Antibody - CBC with Differential/Platelet - COMPLETE METABOLIC PANEL WITH GFR - TSH - PAP, IG w/ Reflex HPV when ASCU [[facility]]  Next Appointment: Return in about 1 year (around 03/08/2024) for annual Physical examination.   Caesar Bookman, NP

## 2023-03-10 LAB — CBC WITH DIFFERENTIAL/PLATELET
Absolute Monocytes: 615 cells/uL (ref 200–950)
Basophils Absolute: 30 cells/uL (ref 0–200)
Basophils Relative: 0.4 %
Eosinophils Absolute: 30 cells/uL (ref 15–500)
Eosinophils Relative: 0.4 %
HCT: 37.5 % (ref 35.0–45.0)
Hemoglobin: 12.5 g/dL (ref 11.7–15.5)
Lymphs Abs: 2168 cells/uL (ref 850–3900)
MCH: 27.7 pg (ref 27.0–33.0)
MCHC: 33.3 g/dL (ref 32.0–36.0)
MCV: 83.1 fL (ref 80.0–100.0)
MPV: 11.1 fL (ref 7.5–12.5)
Monocytes Relative: 8.2 %
Neutro Abs: 4658 cells/uL (ref 1500–7800)
Neutrophils Relative %: 62.1 %
Platelets: 343 10*3/uL (ref 140–400)
RBC: 4.51 10*6/uL (ref 3.80–5.10)
RDW: 14.8 % (ref 11.0–15.0)
Total Lymphocyte: 28.9 %
WBC: 7.5 10*3/uL (ref 3.8–10.8)

## 2023-03-10 LAB — COMPLETE METABOLIC PANEL WITH GFR
AG Ratio: 1.5 (calc) (ref 1.0–2.5)
ALT: 19 U/L (ref 6–29)
AST: 20 U/L (ref 10–30)
Albumin: 4.6 g/dL (ref 3.6–5.1)
Alkaline phosphatase (APISO): 63 U/L (ref 31–125)
BUN: 10 mg/dL (ref 7–25)
CO2: 27 mmol/L (ref 20–32)
Calcium: 9.7 mg/dL (ref 8.6–10.2)
Chloride: 101 mmol/L (ref 98–110)
Creat: 0.72 mg/dL (ref 0.50–0.97)
Globulin: 3.1 g/dL (calc) (ref 1.9–3.7)
Glucose, Bld: 90 mg/dL (ref 65–99)
Potassium: 4.1 mmol/L (ref 3.5–5.3)
Sodium: 138 mmol/L (ref 135–146)
Total Bilirubin: 0.7 mg/dL (ref 0.2–1.2)
Total Protein: 7.7 g/dL (ref 6.1–8.1)
eGFR: 110 mL/min/{1.73_m2} (ref >=60)

## 2023-03-10 LAB — TSH: TSH: 1.95 mIU/L

## 2023-03-10 LAB — HEPATITIS C ANTIBODY: Hepatitis C Ab: NONREACTIVE

## 2023-03-11 LAB — PAP IG W/ RFLX HPV ASCU

## 2023-03-14 MED ORDER — METRONIDAZOLE 500 MG PO TABS: 500.0000 mg | ORAL_TABLET | Freq: Two times a day (BID) | ORAL | 0 refills | Status: AC

## 2023-03-14 NOTE — Addendum Note (Signed)
Addended by: Jama Flavors on: 03/14/2023 03:16 PM   Modules accepted: Orders

## 2023-03-22 DIAGNOSIS — A6004 Herpesviral vulvovaginitis: Secondary | ICD-10-CM

## 2023-03-23 MED ORDER — VALACYCLOVIR HCL 1 G PO TABS
1000.0000 mg | ORAL_TABLET | Freq: Two times a day (BID) | ORAL | 1 refills | Status: DC
Start: 2023-03-23 — End: 2023-06-20

## 2023-06-11 DIAGNOSIS — A6004 Herpesviral vulvovaginitis: Secondary | ICD-10-CM

## 2023-06-20 MED ORDER — VALACYCLOVIR HCL 1 G PO TABS
1000.0000 mg | ORAL_TABLET | Freq: Two times a day (BID) | ORAL | 1 refills | Status: DC
Start: 2023-06-20 — End: 2023-07-14

## 2023-06-20 NOTE — Telephone Encounter (Signed)
Patient requested refill Pended Rx and sent to [facility] for approval.

## 2023-06-20 NOTE — Addendum Note (Signed)
Addended byRicharda Blade C on: 06/20/2023 05:48 PM   Modules accepted: Orders

## 2023-06-20 NOTE — Addendum Note (Signed)
Addended by: Nelda Severe A on: 06/20/2023 08:04 AM   Modules accepted: Orders

## 2023-07-14 ENCOUNTER — Encounter: Payer: Self-pay | Admitting: Family

## 2023-07-14 ENCOUNTER — Ambulatory Visit (INDEPENDENT_AMBULATORY_CARE_PROVIDER_SITE_OTHER): Payer: No Typology Code available for payment source | Admitting: Family

## 2023-07-14 VITALS — BP 112/76 | HR 82 | Temp 98.0°F | Resp 17 | Ht 65.7 in | Wt 170.6 lb

## 2023-07-14 DIAGNOSIS — B3731 Acute candidiasis of vulva and vagina: Secondary | ICD-10-CM | POA: Diagnosis not present

## 2023-07-14 DIAGNOSIS — A6004 Herpesviral vulvovaginitis: Secondary | ICD-10-CM | POA: Diagnosis not present

## 2023-07-14 DIAGNOSIS — L02225 Furuncle of perineum: Secondary | ICD-10-CM

## 2023-07-14 MED ORDER — NYSTATIN 100000 UNIT/GM EX CREA
1.0000 | TOPICAL_CREAM | Freq: Two times a day (BID) | CUTANEOUS | 0 refills | Status: DC
Start: 2023-07-14 — End: 2024-05-31

## 2023-07-14 MED ORDER — VALACYCLOVIR HCL 1 G PO TABS
1000.0000 mg | ORAL_TABLET | Freq: Two times a day (BID) | ORAL | 3 refills | Status: DC
Start: 2023-07-14 — End: 2024-03-05

## 2023-07-14 MED ORDER — FLUCONAZOLE 150 MG PO TABS
150.0000 mg | ORAL_TABLET | Freq: Every day | ORAL | 0 refills | Status: AC
Start: 2023-07-14 — End: 2023-07-17

## 2023-07-14 MED ORDER — CEPHALEXIN 500 MG PO CAPS: 500.0000 mg | ORAL_CAPSULE | Freq: Two times a day (BID) | ORAL | 0 refills | Status: AC

## 2023-07-14 NOTE — Patient Instructions (Signed)
Apply warm compressor to bump area on left inner thigh for 15-20 minutes to keep swelling down.

## 2023-07-14 NOTE — Progress Notes (Signed)
Provider: Richarda Blade FNP-C  Danzel Marszalek, Donalee Citrin, NP  Patient Care Team: Denney Shein, Donalee Citrin, NP as PCP - General (Family Medicine)  Extended Emergency Contact Information Primary Emergency Contact: Horine,Linda Address: [address]          [place], [place] [address] [place] Mobile Phone: [phone] Relation: Mother Secondary Emergency Contact: Johnson,Shikil Mobile Phone: [phone] Relation: Brother  Code Status:  Full Code Goals of care: Advanced Directive information    07/14/2023    3:54 PM  Advanced Directives  Does Patient Have a Medical Advance Directive? Yes  Type of Estate agent of [place];Living will;Out of facility DNR (pink MOST or yellow form)  Does patient want to make changes to medical advance directive? No - Patient declined  Copy of Healthcare Power of Attorney in Chart? No - copy requested     Chief Complaint  Patient presents with   Acute Visit    Patient is being seen for bump by left thigh. Patient states it has some pain and some discharge with lump. Discuss medications and a burning sensation some stinging    Immunizations    Discuss the need for covid and flu vaccine    Health Maintenance    Patient is also due for pap smear exam    HPI:  Pt is a 37 y.o. female seen today for an acute visit for evaluation of left groin pump that worsen in the past few days.Had pimple like head which has opened up.bleeds sometimes. Also complains of Labia itching for several days. She denies any fever,chills or vaginal discharge.    Past Medical History:  Diagnosis Date   Chronic dental pain    Genital herpes    History reviewed. No pertinent surgical history.  No Known Allergies  Outpatient Encounter Medications as of 07/14/2023  Medication Sig   valACYclovir (VALTREX) 1000 MG tablet Take 1 tablet (1,000 mg total) by mouth 2 (two) times daily.   cetirizine (ZYRTEC) 10 MG tablet Take 1 tablet (10 mg total)  by mouth daily for 14 days.   No facility-administered encounter medications on file as of 07/14/2023.    Review of Systems  Constitutional:  Negative for appetite change, chills, fatigue, fever and unexpected weight change.  Respiratory:  Negative for cough, chest tightness, shortness of breath and wheezing.   Cardiovascular:  Negative for chest pain, palpitations and leg swelling.  Gastrointestinal:  Negative for abdominal distention, abdominal pain, blood in stool, constipation, diarrhea, nausea and vomiting.  Genitourinary:  Negative for difficulty urinating, dysuria, flank pain, frequency and urgency.  Musculoskeletal:  Negative for arthralgias, back pain, gait problem, joint swelling and myalgias.  Skin:  Positive for rash. Negative for color change, pallor and wound.    Immunization History  Administered Date(s) Administered   Tdap 03/09/2023   Pertinent  Health Maintenance Due  Topic Date Due   INFLUENZA VACCINE  06/30/2023   PAP SMEAR-Modifier  03/08/2026      03/04/2019    8:03 PM 09/03/2019    4:40 PM 09/04/2019   12:01 AM 02/28/2023    1:51 PM 07/14/2023    3:54 PM  Fall Risk  Falls in the past year?    0 0  Was there an injury with Fall?    0 0  Fall Risk Category Calculator    0 0  (RETIRED) Patient Fall Risk Level Low fall risk Low fall risk Low fall risk    Patient at Risk for Falls  Due to     No Fall Risks  Fall risk Follow up     Falls evaluation completed   Functional Status Survey:    Vitals:   07/14/23 1554  BP: 112/76  Pulse: 82  Resp: 17  Temp: 98 F (36.7 C)  TempSrc: Temporal  SpO2: 99%  Weight: 170 lb 9.6 oz (77.4 kg)  Height: 5' 5.7" (1.669 m)   Body mass index is 27.79 kg/m. Physical Exam Vitals reviewed.  Constitutional:      General: She is not in acute distress.    Appearance: Normal appearance. She is normal weight. She is not ill-appearing or diaphoretic.  HENT:     Head: Normocephalic.     Mouth/Throat:     Mouth: Mucous  membranes are moist.     Pharynx: Oropharynx is clear. No oropharyngeal exudate or posterior oropharyngeal erythema.  Eyes:     General: No scleral icterus.       Right eye: No discharge.        Left eye: No discharge.     Conjunctiva/sclera: Conjunctivae normal.     Pupils: Pupils are equal, round, and reactive to light.  Cardiovascular:     Rate and Rhythm: Normal rate and regular rhythm.     Pulses: Normal pulses.     Heart sounds: Normal heart sounds. No murmur heard.    No friction rub. No gallop.  Pulmonary:     Effort: Pulmonary effort is normal. No respiratory distress.     Breath sounds: Normal breath sounds. No wheezing, rhonchi or rales.  Chest:     Chest wall: No tenderness.  Abdominal:     General: Bowel sounds are normal. There is no distension.     Palpations: Abdomen is soft. There is no mass.     Tenderness: There is no abdominal tenderness. There is no right CVA tenderness, left CVA tenderness, guarding or rebound.  Musculoskeletal:     Cervical back: Normal range of motion. No rigidity or tenderness.  Lymphadenopathy:     Cervical: No cervical adenopathy.  Skin:    General: Skin is warm and dry.     Coloration: Skin is not pale.     Findings: No bruising, erythema, lesion or rash.     Comments: Left inner thigh small swollen firm area with pimple size open area with wound bed red in color without any drainage.surrounding skin tissue firm and without any erythema.   Neurological:     Mental Status: She is alert and oriented to person, place, and time.     Motor: No weakness.     Gait: Gait normal.  Psychiatric:        Mood and Affect: Mood normal.        Speech: Speech normal.        Behavior: Behavior normal.     Labs reviewed: Recent Labs    03/09/23 1558  NA 138  K 4.1  CL 101  CO2 27  GLUCOSE 90  BUN 10  CREATININE 0.72  CALCIUM 9.7   Recent Labs    03/09/23 1558  AST 20  ALT 19  BILITOT 0.7  PROT 7.7   Recent Labs     03/09/23 1558  WBC 7.5  NEUTROABS 4,658  HGB 12.5  HCT 37.5  MCV 83.1  PLT 343   Lab Results  Component Value Date   TSH 1.95 03/09/2023   Lab Results  Component Value Date   HGBA1C 4.9 03/22/2017   No  results found for: "CHOL", "HDL", "LDLCALC", "LDLDIRECT", "TRIG", "CHOLHDL"  Significant Diagnostic Results in last 30 days:  No results found.  Assessment/Plan 1. Furuncle of perineum Afebrile  - advised to apply warm water compressor to affected area for 15-20 minutes to relief swelling  Left inner thigh small swollen firm area with pimple size open area with wound bed red in color without any drainage.surrounding skin tissue firm and without any erythema.  - start on Keflex as below side effects discussed - cephALEXin (KEFLEX) 500 MG capsule; Take 1 capsule (500 mg total) by mouth 2 (two) times daily for 7 days.  Dispense: 14 capsule; Refill: 0  2. Candidiasis of genitalia in female Reports itchy labia  - no excoriation noted  - Nystatin and diflucan as below side effects discussed - fluconazole (DIFLUCAN) 150 MG tablet; Take 1 tablet (150 mg total) by mouth daily for 3 days.  Dispense: 3 tablet; Refill: 0 - nystatin cream (MYCOSTATIN); Apply 1 Application topically 2 (two) times daily.  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  Dispense: 60 tablet; Refill: 3  Family/ staff Communication: Reviewed plan of care with patient verbalized understanding   Labs/tests ordered: None   Next Appointment: Return if symptoms worsen or fail to improve.   Caesar Bookman, NP

## 2023-07-17 ENCOUNTER — Encounter: Payer: Self-pay | Admitting: Family

## 2024-03-03 ENCOUNTER — Other Ambulatory Visit: Payer: Self-pay | Admitting: Family

## 2024-03-03 DIAGNOSIS — A6004 Herpesviral vulvovaginitis: Secondary | ICD-10-CM

## 2024-03-12 ENCOUNTER — Encounter: Payer: No Typology Code available for payment source | Admitting: Family

## 2024-03-19 NOTE — Progress Notes (Signed)
   This encounter was created in error - please disregard. No show

## 2024-05-29 ENCOUNTER — Other Ambulatory Visit: Payer: Self-pay

## 2024-05-29 ENCOUNTER — Emergency Department (HOSPITAL_BASED_OUTPATIENT_CLINIC_OR_DEPARTMENT_OTHER)
Admission: EM | Admit: 2024-05-29 | Discharge: 2024-05-29 | Disposition: A | Discharge status: Home / Self Care | Payer: Self-pay | Attending: Emergency Medicine | Admitting: Emergency Medicine

## 2024-05-29 ENCOUNTER — Emergency Department (HOSPITAL_BASED_OUTPATIENT_CLINIC_OR_DEPARTMENT_OTHER): Payer: Self-pay | Admitting: Radiology

## 2024-05-29 DIAGNOSIS — M25462 Effusion, left knee: Secondary | ICD-10-CM | POA: Diagnosis not present

## 2024-05-29 DIAGNOSIS — W19XXXA Unspecified fall, initial encounter: Secondary | ICD-10-CM | POA: Insufficient documentation

## 2024-05-29 DIAGNOSIS — S8992XA Unspecified injury of left lower leg, initial encounter: Secondary | ICD-10-CM | POA: Insufficient documentation

## 2024-05-29 MED ORDER — OXYCODONE-ACETAMINOPHEN 5-325 MG PO TABS: 1.0000 | ORAL_TABLET | Freq: Four times a day (QID) | ORAL | 0 refills | Status: DC | PRN

## 2024-05-29 MED ORDER — NAPROXEN 500 MG PO TABS: 500.0000 mg | ORAL_TABLET | Freq: Two times a day (BID) | ORAL | 0 refills | Status: DC

## 2024-05-29 MED ORDER — OXYCODONE-ACETAMINOPHEN 5-325 MG PO TABS
1.0000 | ORAL_TABLET | Freq: Once | ORAL | Status: AC
  Administered 2024-05-29: 1 via ORAL
  Filled 2024-05-29: qty 1

## 2024-05-29 NOTE — ED Triage Notes (Signed)
 States fell off mechanical bull Sunday night. C/o left knee pain.

## 2024-05-29 NOTE — ED Notes (Signed)
 DC paperwork given and verbally understood.

## 2024-05-29 NOTE — ED Notes (Signed)
 Pt given ice to place on knee.SABRASABRA

## 2024-05-29 NOTE — Discharge Instructions (Signed)
 Please read and follow all provided instructions.  Your diagnoses today include:  1. Injury of left knee, initial encounter     Tests performed today include: An x-ray of the affected area - does NOT show any broken bones Vital signs. See below for your results today.   Medications prescribed:  Percocet (oxycodone /acetaminophen ) - narcotic pain medication  DO NOT drive or perform any activities that require you to be awake and alert because this medicine can make you drowsy. BE VERY CAREFUL not to take multiple medicines containing Tylenol  (also called acetaminophen ). Doing so can lead to an overdose which can damage your liver and cause liver failure and possibly death.  Naproxen  - anti-inflammatory pain medication Do not exceed 500mg  naproxen  every 12 hours, take with food  You have been prescribed an anti-inflammatory medication or NSAID. Take with food. Take smallest effective dose for the shortest duration needed for your pain. Stop taking if you experience stomach pain or vomiting.   Take any prescribed medications only as directed.  Home care instructions:  Follow any educational materials contained in this packet Use crutches until cleared by orthopedics Follow R.I.C.E. Protocol: R - rest your injury  I  - use ice on injury without applying directly to skin C - compress injury with bandage or splint E - elevate the injury as much as possible  Follow-up instructions: Please follow-up with the provided orthopedic physician in 5 days.   Return instructions:  Please return if your toes or feet are numb or tingling, appear gray or blue, or you have severe pain (also elevate the leg and loosen splint or wrap if you were given one) Please return to the Emergency Department if you experience worsening symptoms.  Please return if you have any other emergent concerns.  Additional Information:  Your vital signs today were: BP 129/80 (BP Location: Right Arm)   Pulse 83   Temp  98.1 F (36.7 C) (Oral)   Resp 18   SpO2 100%  If your blood pressure (BP) was elevated above 135/85 this visit, please have this repeated by your doctor within one month. --------------

## 2024-05-29 NOTE — ED Provider Notes (Signed)
 [facility] AT [facility] Provider Note   CSN: [id number] Arrival date & time: 05/29/24  1005     Patient presents with: Knee Pain   Kimberly Yoder is a 38 y.o. female.   Patient presents to the emergency department today for evaluation of left knee pain.  Symptoms started acutely 2 days ago.  Patient fell a couple of feet off of a mechanical bull.  Patient states that she twisted her knee when she landed.  When she tried to get up, her knee buckled.  She has not been able to bear weight on the knee due to pain since that time.  She has been taking over-the-counter medications.  Denies head or neck injury.       Prior to Admission medications   Medication Sig Start Date End Date Taking? Authorizing Provider  cetirizine  (ZYRTEC ) 10 MG tablet Take 1 tablet (10 mg total) by mouth daily for 14 days. 02/28/23 03/14/23  Ngetich, Dinah C, NP  nystatin  cream (MYCOSTATIN ) Apply 1 Application topically 2 (two) times daily. 07/14/23   Ngetich, Dinah C, NP  valACYclovir  (VALTREX ) 1000 MG tablet TAKE 1 TABLET BY MOUTH TWICE A DAY 03/05/24   Ngetich, Dinah C, NP    Allergies: Patient has no known allergies.    Review of Systems  Updated Vital Signs BP 129/80 (BP Location: Right Arm)   Pulse 83   Temp 98.1 F (36.7 C) (Oral)   Resp 18   SpO2 100%   Physical Exam Vitals and nursing note reviewed.  Constitutional:      Appearance: She is well-developed.  HENT:     Head: Normocephalic and atraumatic.   Eyes:     Pupils: Pupils are equal, round, and reactive to light.    Cardiovascular:     Pulses: Normal pulses. No decreased pulses.   Musculoskeletal:        General: Tenderness present.     Cervical back: Normal range of motion and neck supple.     Right knee: No effusion. Normal range of motion. No tenderness.     Left knee: Effusion (small) present. Decreased range of motion. Tenderness present over the lateral joint line.     Left ankle: No  tenderness. Normal range of motion.   Skin:    General: Skin is warm and dry.   Neurological:     Mental Status: She is alert.     Sensory: No sensory deficit.     Comments: Motor, sensation, and vascular distal to the injury is fully intact.   Psychiatric:        Mood and Affect: Mood normal.     (all labs ordered are listed, but only abnormal results are displayed) Labs Reviewed - No data to display  EKG: None  Radiology: DG Knee Complete 4 Views Left Result Date: 05/29/2024 CLINICAL DATA:  Injury.  Pain EXAM: LEFT KNEE - COMPLETE 4 VIEW COMPARISON:  None Available. FINDINGS: No fracture or dislocation. Preserved joint spaces and bone mineralization. There is a small joint effusion on lateral view however. IMPRESSION: Small joint effusion. This has a differential. Please correlate for any clinical signs of infection. This could be traumatic. Further workup as clinically and when clinically appropriate Electronically Signed   By: Ranell Bring M.D.   On: 05/29/2024 11:20     Procedures   Medications Ordered in the ED - No data to display  ED Course  Patient seen and examined. History obtained directly from patient.  Labs/EKG: None ordered  Imaging: Ordered x-ray of the left knee  Medications/Fluids: None ordered  Most recent vital signs reviewed and are as follows: BP 129/80 (BP Location: Right Arm)   Pulse 83   Temp 98.1 F (36.7 C) (Oral)   Resp 18   SpO2 100%   Initial impression: Left knee injury and pain  11:50 AM Reassessment performed. Patient appears stable.  She received a dose of Percocet orally for pain.  Imaging personally visualized and interpreted including: Agree small effusion, no fracture or dislocation  Reviewed pertinent lab work and imaging with patient at bedside. Questions answered.   Most current vital signs reviewed and are as follows: BP 129/80 (BP Location: Right Arm)   Pulse 83   Temp 98.1 F (36.7 C) (Oral)   Resp 18   SpO2  100%   Plan: Discharge to home.  Will provide with knee immobilizer and crutches.  Prescriptions written for: Naproxen , Percocet # 8 tablets  Other home care instructions discussed: RICE protocol  ED return instructions discussed: New or worsening symptoms  Follow-up instructions discussed: Patient encouraged to follow-up with orthopedics in the next 5 days.                                  Medical Decision Making Amount and/or Complexity of Data Reviewed Radiology: ordered.  Risk Prescription drug management.   Patient with left knee injury pain and swelling.  Likely internal injury.  She cannot tolerate much of an exam here other than light palpation.  Decreased range of motion.  X-rays negative.  Lower extremities are neurovascularly intact.  Given amount of pain and disability, feel that she should follow-up with orthopedics, to evaluate for more significant internal injury.     Final diagnoses:  Injury of left knee, initial encounter    ED Discharge Orders          Ordered    oxyCODONE -acetaminophen  (PERCOCET/ROXICET) 5-325 MG tablet  Every 6 hours PRN        05/29/24 1148    naproxen  (NAPROSYN ) 500 MG tablet  2 times daily        05/29/24 1148               Desiderio Chew, PA-C 05/29/24 1152    Cottie Donnice PARAS, MD 05/29/24 1235

## 2024-05-30 ENCOUNTER — Telehealth: Payer: Self-pay

## 2024-05-30 NOTE — Transitions of Care (Post Inpatient/ED Visit) (Signed)
   05/30/2024  Name: Kimberly Yoder MRN: [id number] DOB: [birth year]  Today's TOC FU Call Status: Today's TOC FU Call Status:: Successful TOC FU Call Completed TOC FU Call Complete Date: 05/30/24 Patient's Name and Date of Birth confirmed.  Transition Care Management Follow-up Telephone Call Date of Discharge: 05/29/24 Discharge Facility: [facility] ([facility]) Type of Discharge: Emergency Department Reason for ED Visit: Other: (Left knee injury) How have you been since you were released from the hospital?: Same Any questions or concerns?: Yes Patient Questions/Concerns:: Patient will save questions for appointment. Patient Questions/Concerns Addressed: Other:  Items Reviewed: Did you receive and understand the discharge instructions provided?: Yes Any new allergies since your discharge?: No Dietary orders reviewed?: No Do you have support at home?: Yes People in Home [RPT]: parent(s) Name of Support/Comfort Primary Source: Rock  Medications Reviewed Today: Medications Reviewed Today     Reviewed by Nyles Mitton E, CMA (Certified Medical Assistant) on 05/30/24 at 1104  Med List Status: <None>   Medication Order Taking? Sig Documenting Provider Last Dose Status Informant  cetirizine  (ZYRTEC ) 10 MG tablet [id number]  Take 1 tablet (10 mg total) by mouth daily for 14 days. Ngetich, Dinah C, NP  Expired 03/14/23 2359   naproxen  (NAPROSYN ) 500 MG tablet [id number] Yes Take 1 tablet (500 mg total) by mouth 2 (two) times daily. Desiderio Chew, PA-C  Active   nystatin  cream (MYCOSTATIN ) [id number] Yes Apply 1 Application topically 2 (two) times daily. Ngetich, Dinah C, NP  Active   oxyCODONE -acetaminophen  (PERCOCET/ROXICET) 5-325 MG tablet [id number] Yes Take 1 tablet by mouth every 6 (six) hours as needed for severe pain (pain score 7-10). Desiderio Chew, PA-C  Active   valACYclovir  (VALTREX ) 1000 MG tablet [id number] Yes TAKE 1 TABLET BY MOUTH TWICE A DAY Ngetich, Dinah C, NP  Active              Home Care and Equipment/Supplies: Were Home Health Services Ordered?: No Any new equipment or medical supplies ordered?: Yes Name of Medical supply agency?: [employer] Were you able to get the equipment/medical supplies?: Yes Do you have any questions related to the use of the equipment/supplies?: Yes What questions do you have?: How long do I have to use the crutches?  Functional Questionnaire: Do you need assistance with bathing/showering or dressing?: No Do you need assistance with meal preparation?: No Do you need assistance with eating?: No Do you have difficulty maintaining continence: No Do you need assistance with getting out of bed/getting out of a chair/moving?: No Do you have difficulty managing or taking your medications?: No  Follow up appointments reviewed: PCP Follow-up appointment confirmed?: Yes Date of PCP follow-up appointment?: 05/30/24 Follow-up Provider: Greig Cluster, NP Specialist Hospital Follow-up appointment confirmed?: No Reason Specialist Follow-Up Not Confirmed: Appointment Sceduled by [facility] Calling Clinician Do you need transportation to your follow-up appointment?: No Do you understand care options if your condition(s) worsen?: Yes-patient verbalized understanding    SIGNATURE: Berda Shelvin.D/CMA

## 2024-05-31 ENCOUNTER — Encounter: Payer: Self-pay | Admitting: Orthopedic Surgery

## 2024-05-31 ENCOUNTER — Ambulatory Visit (INDEPENDENT_AMBULATORY_CARE_PROVIDER_SITE_OTHER): Admitting: Orthopedic Surgery

## 2024-05-31 VITALS — BP 112/80 | HR 64 | Temp 97.7°F | Resp 16 | Ht 65.7 in

## 2024-05-31 DIAGNOSIS — M25562 Pain in left knee: Secondary | ICD-10-CM | POA: Diagnosis not present

## 2024-05-31 NOTE — Patient Instructions (Signed)
 Continue naproxen / Alleve> take with food  Ice as much as you want> 30 min and 30 min

## 2024-05-31 NOTE — Progress Notes (Signed)
 Careteam: Patient Care Team: Ngetich, Roxan BROCKS, NP as PCP - General (Family Medicine)  Seen by: Greig Cluster, AGNP-C  PLACE OF SERVICE:  [facility]  Advanced Directive information    No Known Allergies  Chief Complaint  Patient presents with   Transitions Of Care    Patient stated her right leg was the size of a soccer ball due to pt injuring her knee / pt stated that she had only been taking tylenol  before hospital visit. Patient has has X-rays done and stated that the x-ray examines fluid. Pt pain score today sitting : 6 moving : 10      HPI: Patient is a 38 y.o. female seen today for follow up s/p ED visit 07/01 due to left knee pain.   07/01 she fell off a mechanical bull while out with friends. She tried to get up and her knee buckled. She was unable to bear weight after accident. Xrays in ED negative for acute fracture to dislocation. She was prescribed oxycodone  for pain relief and advised to contact orthopedic specialist. Today, she reports improved pain and swelling to left knee. She continues to have swelling near MCL/meniscus. She has been using naproxen  and ice applications for pain relief. Oxycodone  makes her sleepy and she has not taken many doses. She has been ambulating with crutches. She has not contacted ortho and is requesting referral. Also requesting handicap application to be filled out.   Review of Systems:  Review of Systems  Constitutional:  Negative for fever.  Respiratory:  Negative for shortness of breath.   Cardiovascular:  Negative for chest pain.  Musculoskeletal:  Positive for falls and joint pain.  Psychiatric/Behavioral:  Negative for depression.     Past Medical History:  Diagnosis Date   Chronic dental pain    Genital herpes    History reviewed. No pertinent surgical history. Social History:   reports that she has quit smoking. Her smoking use included cigarettes. She has been exposed to tobacco smoke. She has never used smokeless tobacco.  She reports current alcohol use. She reports that she does not use drugs.  Family History  Problem Relation Age of Onset   Cancer Father    CAD Other     Medications: Patient's Medications  New Prescriptions   No medications on file  Previous Medications   NAPROXEN  (NAPROSYN ) 500 MG TABLET    Take 1 tablet (500 mg total) by mouth 2 (two) times daily.   OXYCODONE -ACETAMINOPHEN  (PERCOCET/ROXICET) 5-325 MG TABLET    Take 1 tablet by mouth every 6 (six) hours as needed for severe pain (pain score 7-10).   VALACYCLOVIR  (VALTREX ) 1000 MG TABLET    TAKE 1 TABLET BY MOUTH TWICE A DAY  Modified Medications   No medications on file  Discontinued Medications   CETIRIZINE  (ZYRTEC ) 10 MG TABLET    Take 1 tablet (10 mg total) by mouth daily for 14 days.   NYSTATIN  CREAM (MYCOSTATIN )    Apply 1 Application topically 2 (two) times daily.    Physical Exam:  Vitals:   05/31/24 1428  Height: 5' 5.7 (1.669 m)   Body mass index is 27.79 kg/m. Wt Readings from Last 3 Encounters:  07/14/23 170 lb 9.6 oz (77.4 kg)  03/09/23 170 lb 6.4 oz (77.3 kg)  02/28/23 171 lb 12.8 oz (77.9 kg)    Physical Exam Vitals reviewed.  Constitutional:      General: She is not in acute distress. HENT:     Head:  Normocephalic.  Eyes:     General:        Right eye: No discharge.        Left eye: No discharge.  Cardiovascular:     Rate and Rhythm: Normal rate and regular rhythm.     Pulses: Normal pulses.     Heart sounds: Normal heart sounds.  Pulmonary:     Effort: Pulmonary effort is normal.     Breath sounds: Normal breath sounds.  Musculoskeletal:        General: Swelling and tenderness present.     Cervical back: Neck supple.     Left knee: Effusion present. No crepitus. Decreased range of motion. Tenderness present over the MCL.  Skin:    General: Skin is warm.     Capillary Refill: Capillary refill takes less than 2 seconds.  Neurological:     General: No focal deficit present.     Mental  Status: She is alert and oriented to person, place, and time.     Gait: Gait abnormal.  Psychiatric:        Mood and Affect: Mood normal.     Labs reviewed: Basic Metabolic Panel: No results for input(s): NA, K, CL, CO2, GLUCOSE, BUN, CREATININE, CALCIUM, MG, PHOS, TSH in the last 8760 hours. Liver Function Tests: No results for input(s): AST, ALT, ALKPHOS, BILITOT, PROT, ALBUMIN in the last 8760 hours. No results for input(s): LIPASE, AMYLASE in the last 8760 hours. No results for input(s): AMMONIA in the last 8760 hours. CBC: No results for input(s): WBC, NEUTROABS, HGB, HCT, MCV, PLT in the last 8760 hours. Lipid Panel: No results for input(s): CHOL, HDL, LDLCALC, TRIG, CHOLHDL, LDLDIRECT in the last 8760 hours. TSH: No results for input(s): TSH in the last 8760 hours. A1C: Lab Results  Component Value Date   HGBA1C 4.9 03/22/2017     Assessment/Plan: 1. Acute pain of left knee (Primary) - 07/01 fell off mechanical bull> knee buckled while getting up - 07/01 ED eval> xray no acute fracture or dislocation - ambulating with crutches - improved pain and swelling today - effusion near MCL/meniscus - cont alleve prn and ice  - will make referral to ortho - Ambulatory referral to Orthopedic Surgery  Total time: 21 minutes. Greater than 50% of total time spent doing patient education regarding acute knee pain including symptom/medication management.    Next appt: Visit date not found  [facility]  [facility] [phone]

## 2024-06-14 ENCOUNTER — Ambulatory Visit: Payer: Self-pay | Admitting: Physician Assistant

## 2024-10-01 ENCOUNTER — Encounter: Payer: Self-pay | Admitting: Radiology

## 2024-12-27 ENCOUNTER — Ambulatory Visit: Admitting: Family

## 2024-12-27 ENCOUNTER — Encounter: Payer: Self-pay | Admitting: Family

## 2024-12-27 VITALS — BP 110/80 | HR 78 | Temp 97.7°F | Ht 67.0 in | Wt 181.8 lb

## 2024-12-27 DIAGNOSIS — A6004 Herpesviral vulvovaginitis: Secondary | ICD-10-CM | POA: Diagnosis not present

## 2024-12-27 DIAGNOSIS — Z113 Encounter for screening for infections with a predominantly sexual mode of transmission: Secondary | ICD-10-CM | POA: Diagnosis not present

## 2024-12-27 DIAGNOSIS — R197 Diarrhea, unspecified: Secondary | ICD-10-CM | POA: Diagnosis not present

## 2024-12-27 DIAGNOSIS — Z0000 Encounter for general adult medical examination without abnormal findings: Secondary | ICD-10-CM | POA: Diagnosis not present

## 2024-12-27 MED ORDER — VALACYCLOVIR HCL 1 G PO TABS: 1000.0000 mg | ORAL_TABLET | Freq: Two times a day (BID) | ORAL | 1 refills | Status: AC

## 2024-12-29 LAB — COMPLETE METABOLIC PANEL WITHOUT GFR
AG Ratio: 1.8 (calc) (ref 1.0–2.5)
ALT: 16 U/L (ref 6–29)
AST: 18 U/L (ref 10–30)
Albumin: 4.8 g/dL (ref 3.6–5.1)
Alkaline phosphatase (APISO): 48 U/L (ref 31–125)
BUN: 12 mg/dL (ref 7–25)
CO2: 25 mmol/L (ref 20–32)
Calcium: 9.6 mg/dL (ref 8.6–10.2)
Chloride: 102 mmol/L (ref 98–110)
Creat: 0.78 mg/dL (ref 0.50–0.97)
Globulin: 2.7 g/dL (ref 1.9–3.7)
Glucose, Bld: 97 mg/dL (ref 65–139)
Potassium: 3.8 mmol/L (ref 3.5–5.3)
Sodium: 136 mmol/L (ref 135–146)
Total Bilirubin: 0.8 mg/dL (ref 0.2–1.2)
Total Protein: 7.5 g/dL (ref 6.1–8.1)

## 2024-12-29 LAB — CBC WITH DIFFERENTIAL/PLATELET
Absolute Lymphocytes: 2261 {cells}/uL (ref 850–3900)
Absolute Monocytes: 765 {cells}/uL (ref 200–950)
Basophils Absolute: 53 {cells}/uL (ref 0–200)
Basophils Relative: 0.6 %
Eosinophils Absolute: 53 {cells}/uL (ref 15–500)
Eosinophils Relative: 0.6 %
HCT: 39.6 % (ref 35.9–46.0)
Hemoglobin: 13 g/dL (ref 11.7–15.5)
MCH: 29.1 pg (ref 27.0–33.0)
MCHC: 32.8 g/dL (ref 31.6–35.4)
MCV: 88.6 fL (ref 81.4–101.7)
MPV: 10.9 fL (ref 7.5–12.5)
Monocytes Relative: 8.6 %
Neutro Abs: 5767 {cells}/uL (ref 1500–7800)
Neutrophils Relative %: 64.8 %
Platelets: 291 10*3/uL (ref 140–400)
RBC: 4.47 Million/uL (ref 3.80–5.10)
RDW: 15.5 % — ABNORMAL HIGH (ref 11.0–15.0)
Total Lymphocyte: 25.4 %
WBC: 8.9 10*3/uL (ref 3.8–10.8)

## 2024-12-29 LAB — HIV ANTIBODY (ROUTINE TESTING W REFLEX)
HIV 1&2 Ab, 4th Generation: NONREACTIVE
HIV FINAL INTERPRETATION: NEGATIVE

## 2024-12-29 LAB — CHLAMYDIA/NEISSERIA GONORRHOEAE RNA,TMA,UROGENTIAL
C. trachomatis RNA, TMA: NOT DETECTED
N. gonorrhoeae RNA, TMA: NOT DETECTED

## 2024-12-29 LAB — TSH: TSH: 0.88 m[IU]/L

## 2025-12-30 ENCOUNTER — Encounter: Admitting: Family
